# Patient Record
Sex: Male | Born: 1960
Health system: Southern US, Community
[De-identification: ages and names within clinical notes are randomized; demographics above are authoritative.]

## PROBLEM LIST (undated history)

## (undated) DIAGNOSIS — I10 Essential (primary) hypertension: Secondary | ICD-10-CM

## (undated) DIAGNOSIS — E119 Type 2 diabetes mellitus without complications: Secondary | ICD-10-CM

---

## 2012-10-05 ENCOUNTER — Ambulatory Visit: Payer: Self-pay | Admitting: Gastroenterology

## 2012-10-06 LAB — PATHOLOGY REPORT

## 2018-10-02 ENCOUNTER — Encounter: Payer: Self-pay | Admitting: Emergency Medicine

## 2018-10-02 ENCOUNTER — Emergency Department
Admission: EM | Admit: 2018-10-02 | Discharge: 2018-10-02 | Disposition: A | Discharge status: Home / Self Care | Payer: BLUE CROSS/BLUE SHIELD | Attending: Student in an Organized Health Care Education/Training Program | Admitting: Student in an Organized Health Care Education/Training Program

## 2018-10-02 ENCOUNTER — Emergency Department: Payer: BLUE CROSS/BLUE SHIELD

## 2018-10-02 ENCOUNTER — Other Ambulatory Visit: Payer: Self-pay

## 2018-10-02 DIAGNOSIS — E119 Type 2 diabetes mellitus without complications: Secondary | ICD-10-CM | POA: Insufficient documentation

## 2018-10-02 DIAGNOSIS — I10 Essential (primary) hypertension: Secondary | ICD-10-CM | POA: Diagnosis not present

## 2018-10-02 DIAGNOSIS — N50811 Right testicular pain: Secondary | ICD-10-CM | POA: Insufficient documentation

## 2018-10-02 DIAGNOSIS — N130 Hydronephrosis with ureteropelvic junction obstruction: Secondary | ICD-10-CM | POA: Diagnosis not present

## 2018-10-02 DIAGNOSIS — Z79899 Other long term (current) drug therapy: Secondary | ICD-10-CM | POA: Diagnosis not present

## 2018-10-02 DIAGNOSIS — Q6211 Congenital occlusion of ureteropelvic junction: Secondary | ICD-10-CM

## 2018-10-02 DIAGNOSIS — N201 Calculus of ureter: Secondary | ICD-10-CM | POA: Insufficient documentation

## 2018-10-02 HISTORY — DX: Type 2 diabetes mellitus without complications: E11.9

## 2018-10-02 HISTORY — DX: Essential (primary) hypertension: I10

## 2018-10-02 LAB — URINALYSIS, COMPLETE (UACMP) WITH MICROSCOPIC
Bacteria, UA: NONE SEEN
Bilirubin Urine: NEGATIVE
Glucose, UA: >=500 mg/dL — AB
Hgb urine dipstick: NEGATIVE
Ketones, ur: 20 mg/dL — AB
Leukocytes, UA: NEGATIVE
Nitrite: NEGATIVE
Protein, ur: NEGATIVE mg/dL
Specific Gravity, Urine: 1.025 (ref 1.005–1.030)
pH: 5 (ref 5.0–8.0)

## 2018-10-02 LAB — CBC WITH DIFFERENTIAL/PLATELET
Abs Immature Granulocytes: 0.09 10*3/uL — ABNORMAL HIGH (ref 0.00–0.07)
Basophils Absolute: 0.1 10*3/uL (ref 0.0–0.1)
Basophils Relative: 1 %
Eosinophils Absolute: 0.1 10*3/uL (ref 0.0–0.5)
Eosinophils Relative: 1 %
HCT: 41.2 % (ref 39.0–52.0)
Hemoglobin: 13.4 g/dL (ref 13.0–17.0)
Immature Granulocytes: 1 %
Lymphocytes Relative: 6 %
Lymphs Abs: 0.9 10*3/uL (ref 0.7–4.0)
MCH: 28 pg (ref 26.0–34.0)
MCHC: 32.5 g/dL (ref 30.0–36.0)
MCV: 86.2 fL (ref 80.0–100.0)
Monocytes Absolute: 0.9 10*3/uL (ref 0.1–1.0)
Monocytes Relative: 7 %
Neutro Abs: 12.4 10*3/uL — ABNORMAL HIGH (ref 1.7–7.7)
Neutrophils Relative %: 84 %
Platelets: 282 10*3/uL (ref 150–400)
RBC: 4.78 MIL/uL (ref 4.22–5.81)
RDW: 13.2 % (ref 11.5–15.5)
WBC: 14.5 10*3/uL — ABNORMAL HIGH (ref 4.0–10.5)
nRBC: 0 % (ref 0.0–0.2)

## 2018-10-02 LAB — COMPREHENSIVE METABOLIC PANEL
ALT: 19 U/L (ref 0–44)
AST: 22 U/L (ref 15–41)
Albumin: 4.1 g/dL (ref 3.5–5.0)
Alkaline Phosphatase: 53 U/L (ref 38–126)
Anion gap: 13 (ref 5–15)
BUN: 25 mg/dL — ABNORMAL HIGH (ref 6–20)
CO2: 22 mmol/L (ref 22–32)
Calcium: 9.1 mg/dL (ref 8.9–10.3)
Chloride: 103 mmol/L (ref 98–111)
Creatinine, Ser: 1.4 mg/dL — ABNORMAL HIGH (ref 0.61–1.24)
GFR calc Af Amer: >60 mL/min (ref >60)
GFR calc non Af Amer: 54 mL/min — ABNORMAL LOW (ref >60)
Glucose, Bld: 272 mg/dL — ABNORMAL HIGH (ref 70–99)
Potassium: 4 mmol/L (ref 3.5–5.1)
Sodium: 138 mmol/L (ref 135–145)
Total Bilirubin: 0.7 mg/dL (ref 0.3–1.2)
Total Protein: 7.6 g/dL (ref 6.5–8.1)

## 2018-10-02 MED ORDER — MORPHINE SULFATE (PF) 4 MG/ML IV SOLN
4.0000 mg | INTRAVENOUS | Status: DC | PRN
  Filled 2018-10-02: qty 1

## 2018-10-02 MED ORDER — SODIUM CHLORIDE 0.9 % IV BOLUS
500.0000 mL | Freq: Once | INTRAVENOUS | Status: AC
  Administered 2018-10-02: 500 mL via INTRAVENOUS

## 2018-10-02 MED ORDER — HYDROCODONE-ACETAMINOPHEN 5-325 MG PO TABS
1.0000 | ORAL_TABLET | Freq: Once | ORAL | Status: AC
  Administered 2018-10-02: 1 via ORAL
  Filled 2018-10-02: qty 1

## 2018-10-02 MED ORDER — TAMSULOSIN HCL 0.4 MG PO CAPS
0.4000 mg | ORAL_CAPSULE | Freq: Every day | ORAL | Status: DC
  Administered 2018-10-02: 0.4 mg via ORAL
  Filled 2018-10-02: qty 1

## 2018-10-02 MED ORDER — HYDROCODONE-ACETAMINOPHEN 5-325 MG PO TABS: 1.0000 | ORAL_TABLET | ORAL | 0 refills | Status: DC | PRN

## 2018-10-02 MED ORDER — PROMETHAZINE HCL 12.5 MG PO TABS: 12.5000 mg | ORAL_TABLET | Freq: Four times a day (QID) | ORAL | 0 refills | Status: DC | PRN

## 2018-10-02 MED ORDER — PROMETHAZINE HCL 25 MG/ML IJ SOLN
12.5000 mg | Freq: Four times a day (QID) | INTRAMUSCULAR | Status: DC | PRN
  Administered 2018-10-02: 12.5 mg via INTRAVENOUS
  Filled 2018-10-02: qty 1

## 2018-10-02 MED ORDER — KETOROLAC TROMETHAMINE 30 MG/ML IJ SOLN
15.0000 mg | Freq: Once | INTRAMUSCULAR | Status: AC
  Administered 2018-10-02: 15 mg via INTRAVENOUS
  Filled 2018-10-02: qty 1

## 2018-10-02 MED ORDER — TAMSULOSIN HCL 0.4 MG PO CAPS: 0.4000 mg | ORAL_CAPSULE | Freq: Every day | ORAL | 0 refills | Status: DC

## 2018-10-02 NOTE — ED Triage Notes (Signed)
Patient ambulatory to triage with steady gait, without difficulty, appears uncomfortable; st having right testicular pain radiating up into abd with diff urinating this am; denies hx of same

## 2018-10-02 NOTE — ED Notes (Signed)
Pt confirmed wife would be driving him home.

## 2018-10-02 NOTE — ED Provider Notes (Signed)
[facility] Emergency Department Provider Note    First MD Initiated Contact with Patient 10/02/18 [phone]     (approximate)  I have reviewed the triage vital signs and the nursing notes.   HISTORY  Chief Complaint Right testicle pain   HPI Aaron Smith is a 57 y.o. male presents the ER for evaluation of sudden onset groin and testicular pain that started around 250 this morning.  States it is waxing and waning.  Mild to moderate in severity.  Denies any dysuria or hematuria.  Is never had pain quite like this before.  Status post appendectomy.  Denies any nausea or vomiting.  No fevers.    Past Medical History:  Diagnosis Date  . Diabetes mellitus without complication (HCC)   . Hypertension    No family history on file.  There are no active problems to display for this patient.     Prior to Admission medications   Medication Sig Start Date End Date Taking? Authorizing Provider  lisinopril-hydrochlorothiazide (PRINZIDE,ZESTORETIC) 20-12.5 MG tablet Take 1 tablet by mouth daily. 01/14/18  Yes [provider]  metFORMIN (GLUCOPHAGE-XR) 500 MG 24 hr tablet Take 1,000 mg by mouth 2 (two) times daily. 12/22/17  Yes [provider]  HYDROcodone-acetaminophen (NORCO) 5-325 MG tablet Take 1 tablet by mouth every 4 (four) hours as needed for moderate pain. 10/02/18   Willy Eddy, MD  promethazine (PHENERGAN) 12.5 MG tablet Take 1 tablet (12.5 mg total) by mouth every 6 (six) hours as needed for nausea or vomiting. 10/02/18   Willy Eddy, MD  tamsulosin (FLOMAX) 0.4 MG CAPS capsule Take 1 capsule (0.4 mg total) by mouth daily after supper. 10/02/18   Willy Eddy, MD    Allergies Penicillins    Social History Social History   Tobacco Use  . Smoking status: Never Smoker  . Smokeless tobacco: Never Used  Substance Use Topics  . Alcohol use: Not on file  . Drug use: Not on file    Review of Systems Patient denies  headaches, rhinorrhea, blurry vision, numbness, shortness of breath, chest pain, edema, cough, abdominal pain, nausea, vomiting, diarrhea, dysuria, fevers, rashes or hallucinations unless otherwise stated above in HPI. ____________________________________________   PHYSICAL EXAM:  VITAL SIGNS: Vitals:   10/02/18 0930 10/02/18 1030  BP: 140/79 118/74  Pulse: (!) 58 65  Resp: 20 18  Temp:    SpO2: 100% 100%    Constitutional: Alert and oriented.  Eyes: Conjunctivae are normal.  Head: Atraumatic. Nose: No congestion/rhinnorhea. Mouth/Throat: Mucous membranes are moist.   Neck: No stridor. Painless ROM.  Cardiovascular: Normal rate, regular rhythm. Grossly normal heart sounds.  Good peripheral circulation. Respiratory: Normal respiratory effort.  No retractions. Lungs CTAB. Gastrointestinal: Soft and nontender. No distention. No abdominal bruits. No CVA tenderness. Genitourinary: normal external genitalia, + cremasteric reflex, no masses Musculoskeletal: No lower extremity tenderness nor edema.  No joint effusions. Neurologic:  Normal speech and language. No gross focal neurologic deficits are appreciated. No facial droop Skin:  Skin is warm, dry and intact. No rash noted. Psychiatric: Mood and affect are normal. Speech and behavior are normal.  ____________________________________________   LABS (all labs ordered are listed, but only abnormal results are displayed)  Results for orders placed or performed during the hospital encounter of 10/02/18 (from the past 24 hour(s))  CBC with Differential/Platelet     Status: Abnormal   Collection Time: 10/02/18  7:20 AM  Result Value Ref Range   WBC 14.5 (H) 4.0 -  10.5 K/uL   RBC 4.78 4.22 - 5.81 MIL/uL   Hemoglobin 13.4 13.0 - 17.0 g/dL   HCT 95.641.2 21.339.0 - 08.652.0 %   MCV 86.2 80.0 - 100.0 fL   MCH 28.0 26.0 - 34.0 pg   MCHC 32.5 30.0 - 36.0 g/dL   RDW 57.813.2 46.911.5 - 62.915.5 %   Platelets 282 150 - 400 K/uL   nRBC 0.0 0.0 - 0.2 %    Neutrophils Relative % 84 %   Neutro Abs 12.4 (H) 1.7 - 7.7 K/uL   Lymphocytes Relative 6 %   Lymphs Abs 0.9 0.7 - 4.0 K/uL   Monocytes Relative 7 %   Monocytes Absolute 0.9 0.1 - 1.0 K/uL   Eosinophils Relative 1 %   Eosinophils Absolute 0.1 0.0 - 0.5 K/uL   Basophils Relative 1 %   Basophils Absolute 0.1 0.0 - 0.1 K/uL   Immature Granulocytes 1 %   Abs Immature Granulocytes 0.09 (H) 0.00 - 0.07 K/uL  Comprehensive metabolic panel     Status: Abnormal   Collection Time: 10/02/18  7:20 AM  Result Value Ref Range   Sodium 138 135 - 145 mmol/L   Potassium 4.0 3.5 - 5.1 mmol/L   Chloride 103 98 - 111 mmol/L   CO2 22 22 - 32 mmol/L   Glucose, Bld 272 (H) 70 - 99 mg/dL   BUN 25 (H) 6 - 20 mg/dL   Creatinine, Ser 5.281.40 (H) 0.61 - 1.24 mg/dL   Calcium 9.1 8.9 - 41.310.3 mg/dL   Total Protein 7.6 6.5 - 8.1 g/dL   Albumin 4.1 3.5 - 5.0 g/dL   AST 22 15 - 41 U/L   ALT 19 0 - 44 U/L   Alkaline Phosphatase 53 38 - 126 U/L   Total Bilirubin 0.7 0.3 - 1.2 mg/dL   GFR calc non Af Amer 54 (L) >60 mL/min   GFR calc Af Amer >60 >60 mL/min   Anion gap 13 5 - 15  Urinalysis, Complete w Microscopic     Status: Abnormal   Collection Time: 10/02/18  8:39 AM  Result Value Ref Range   Color, Urine YELLOW (A) YELLOW   APPearance CLEAR (A) CLEAR   Specific Gravity, Urine 1.025 1.005 - 1.030   pH 5.0 5.0 - 8.0   Glucose, UA >=500 (A) NEGATIVE mg/dL   Hgb urine dipstick NEGATIVE NEGATIVE   Bilirubin Urine NEGATIVE NEGATIVE   Ketones, ur 20 (A) NEGATIVE mg/dL   Protein, ur NEGATIVE NEGATIVE mg/dL   Nitrite NEGATIVE NEGATIVE   Leukocytes, UA NEGATIVE NEGATIVE   RBC / HPF 0-5 0 - 5 RBC/hpf   WBC, UA 0-5 0 - 5 WBC/hpf   Bacteria, UA NONE SEEN NONE SEEN   Squamous Epithelial / LPF 0-5 0 - 5   Mucus PRESENT    ________________________________________________________________________________________  RADIOLOGY  I personally reviewed all radiographic images ordered to evaluate for the above acute  complaints and reviewed radiology reports and findings.  These findings were personally discussed with the patient.  Please see medical record for radiology report.  ____________________________________________   PROCEDURES  Procedure(s) performed:  Procedures    Critical Care performed: no ____________________________________________   INITIAL IMPRESSION / ASSESSMENT AND PLAN / ED COURSE  Pertinent labs & imaging results that were available during my care of the patient were reviewed by me and considered in my medical decision making (see chart for details).   DDX: torsion, hernia, stone, epididymitis, abscess  Payton EmeraldWayne S Somero is a 57  y.o. who presents to the ED with right-sided groin pain and testicular pain as described above.  Patient does appear very uncomfortable.  Exam not consistent with torsion or mass but will order ultrasound to exclude other acute scrotal pathology.  Based on his severe pain will order CT renal to exclude stone.  Will provide IV pain medication as well as IV fluids.  Clinical Course as of Oct 02 1348  Fri Oct 02, 2018  0912 Pain improved.  No evidence of UTI.   Borderline leukocytosis likely 2/2 pain.  No other evidence of SIRS. Discussed results of CT imaging and ultrasound with patient.  Will encourage oral hydration and see if we can transition him to oral pain medications for pain control.   [PR]  1035 I consulted on-call urology, Dr. Richardo Hanks, regarding results.  Patient stable and appropriate for discharge home with close outpatient follow-up.   [PR]    Clinical Course User Index [PR] Willy Eddy, MD     As part of my medical decision making, I reviewed the following data within the electronic MEDICAL RECORD NUMBER Nursing notes reviewed and incorporated, Labs reviewed, notes from prior ED visits.   ____________________________________________   FINAL CLINICAL IMPRESSION(S) / ED DIAGNOSES  Final diagnoses:  Pain in right testicle    Ureterolithiasis  Hydronephrosis with ureteropelvic junction (UPJ) obstruction      NEW MEDICATIONS STARTED DURING THIS VISIT:  Discharge Medication List as of 10/02/2018 10:38 AM    START taking these medications   Details  HYDROcodone-acetaminophen (NORCO) 5-325 MG tablet Take 1 tablet by mouth every 4 (four) hours as needed for moderate pain., Starting Fri 10/02/2018, Print    promethazine (PHENERGAN) 12.5 MG tablet Take 1 tablet (12.5 mg total) by mouth every 6 (six) hours as needed for nausea or vomiting., Starting Fri 10/02/2018, Print    tamsulosin (FLOMAX) 0.4 MG CAPS capsule Take 1 capsule (0.4 mg total) by mouth daily after supper., Starting Fri 10/02/2018, Print         Note:  This document was prepared using Dragon voice recognition software and may include unintentional dictation errors.    Willy Eddy, MD 10/02/18 1349

## 2018-10-05 ENCOUNTER — Encounter: Payer: Self-pay | Admitting: Urology

## 2018-10-05 ENCOUNTER — Other Ambulatory Visit: Payer: Self-pay

## 2018-10-05 ENCOUNTER — Ambulatory Visit: Payer: BLUE CROSS/BLUE SHIELD | Admitting: Urology

## 2018-10-05 ENCOUNTER — Encounter
Admission: RE | Admit: 2018-10-05 | Discharge: 2018-10-05 | Disposition: A | Discharge status: Home / Self Care | Payer: BLUE CROSS/BLUE SHIELD | Source: Ambulatory Visit | Attending: Urology | Admitting: Urology

## 2018-10-05 ENCOUNTER — Ambulatory Visit
Admission: RE | Admit: 2018-10-05 | Discharge: 2018-10-05 | Disposition: A | Discharge status: Home / Self Care | Payer: BLUE CROSS/BLUE SHIELD | Source: Ambulatory Visit | Attending: Urology | Admitting: Urology

## 2018-10-05 ENCOUNTER — Other Ambulatory Visit: Payer: Self-pay | Admitting: Radiology

## 2018-10-05 VITALS — BP 131/93 | HR 88 | Ht 70.0 in | Wt 231.0 lb

## 2018-10-05 DIAGNOSIS — N201 Calculus of ureter: Secondary | ICD-10-CM

## 2018-10-05 DIAGNOSIS — N50811 Right testicular pain: Secondary | ICD-10-CM

## 2018-10-05 LAB — MICROSCOPIC EXAMINATION: RBC, UA: >30 /hpf — ABNORMAL HIGH (ref 0–2)

## 2018-10-05 LAB — URINALYSIS, COMPLETE
Bilirubin, UA: NEGATIVE
Glucose, UA: NEGATIVE
Ketones, UA: NEGATIVE
Nitrite, UA: NEGATIVE
Specific Gravity, UA: >1.03 — ABNORMAL HIGH (ref 1.005–1.030)
Urobilinogen, Ur: 1 mg/dL (ref 0.2–1.0)
pH, UA: 5.5 (ref 5.0–7.5)

## 2018-10-05 NOTE — Progress Notes (Signed)
 10/05/2018 3:15 PM   Aaron Smith 03/15/1961 [id number]  CC: Right flank pain  HPI: I saw Aaron Smith in urology clinic today for right-sided flank pain.  He presented to the emergency department on 10/02/2018 with 12 hours of acute onset severe, sharp, shooting right-sided flank and groin pain down to the right testicle.  CT scan showed a right sided distal ureteral 9 mm stone with upstream hydronephrosis.  Urine was noninfected, and labs were benign.  He was discharged on Flomax and with oral pain control.  He reports his pain has been moderately controlled over the weekend.  He has not had any severe pain today.  He denies any nausea, fevers, or shaking chills.  There are no aggravating factors.  Severity is moderate to severe.  He thinks he had a stone that passed spontaneously 15 to 20 years ago, but has never required surgery.  He is otherwise a relatively healthy, with medical history notable for diabetes and hypertension.   PMH: Past Medical History:  Diagnosis Date  . Diabetes mellitus without complication (HCC)   . Hypertension     Surgical History: History reviewed. No pertinent surgical history.  Allergies:  Allergies  Allergen Reactions  . Penicillins     Family History: History reviewed. No pertinent family history.  Social History:  reports that he has never smoked. He has never used smokeless tobacco. His alcohol and drug histories are not on file.  ROS: Please see flowsheet from today's date for complete review of systems.  Physical Exam: BP (!) 131/93   Pulse 88   Ht 5' 10" (1.778 m)   Wt 231 lb (104.8 kg)   SpO2 98%   BMI 33.15 kg/m    Constitutional:  Alert and oriented, No acute distress. Cardiovascular: Regular rate and rhythm Respiratory: Clear to auscultation bilaterally GI: Abdomen is soft, nontender, nondistended, no abdominal masses GU: Right CVA tenderness Lymph: No cervical or inguinal lymphadenopathy. Skin: No rashes, bruises or  suspicious lesions. Neurologic: Grossly intact, no focal deficits, moving all 4 extremities. Psychiatric: Normal mood and affect.  Laboratory Data: Urinalysis 10/02/2018: No bacteria, 0-5 WBCs, 0-5 RBCs  Pertinent Imaging: I have personally reviewed the CT stone protocol dated 10/02/2018.  Right distal 9 mm ureteral stone with upstream hydronephrosis.  Assessment & Plan:   In summary, Aaron Smith is a healthy 57-year-old man with 3 days of intermittent severe right-sided flank pain, and 9 mm noninfected right distal ureteral stone on CT scan.  We discussed various treatment options for urolithiasis including observation with or without medical expulsive therapy, shockwave lithotripsy (SWL), ureteroscopy and laser lithotripsy with stent placement, and percutaneous nephrolithotomy.  We discussed that management is based on stone size, location, density, patient co-morbidities, and patient preference.   Stones <5mm in size have a >80% spontaneous passage rate. Data surrounding the use of tamsulosin for medical expulsive therapy is controversial, but meta analyses suggests it is most efficacious for distal stones between 5-10mm in size. Possible side effects include dizziness/lightheadedness, and retrograde ejaculation.  SWL has a lower stone free rate in a single procedure, but also a lower complication rate compared to ureteroscopy and avoids a stent and associated stent related symptoms. Possible complications include renal hematoma, steinstrasse, and need for additional treatment.  Ureteroscopy with laser lithotripsy and stent placement has a higher stone free rate than SWL in a single procedure, however increased complication rate including possible infection, ureteral injury, bleeding, and stent related morbidity. Common stent related symptoms include dysuria,   urgency/frequency, and flank pain.  After an extensive discussion of the risks and benefits of the above treatment options, the  patient would like to proceed with right ureteroscopy, laser lithotripsy, stent placement  -KUB today to confirm presence of stone -Continue Flomax and strain urine -Schedule right ureteroscopy, laser lithotripsy, stent placement with my partner Dr. Stoioff this Wednesday  Kenyanna Grzesiak C Ellianna Ruest, MD  [facility] [address], [address] [place], [place] [address] [phone]   

## 2018-10-05 NOTE — H&P (View-Only) (Signed)
10/05/2018 3:15 PM   Aaron Smith Jun 22, 1961 [id number]  CC: Right flank pain  HPI: I saw Aaron Smith in urology clinic today for right-sided flank pain.  He presented to the emergency department on 10/02/2018 with 12 hours of acute onset severe, sharp, shooting right-sided flank and groin pain down to the right testicle.  CT scan showed a right sided distal ureteral 9 mm stone with upstream hydronephrosis.  Urine was noninfected, and labs were benign.  He was discharged on Flomax and with oral pain control.  He reports his pain has been moderately controlled over the weekend.  He has not had any severe pain today.  He denies any nausea, fevers, or shaking chills.  There are no aggravating factors.  Severity is moderate to severe.  He thinks he had a stone that passed spontaneously 15 to 20 years ago, but has never required surgery.  He is otherwise a relatively healthy, with medical history notable for diabetes and hypertension.   PMH: Past Medical History:  Diagnosis Date  . Diabetes mellitus without complication (HCC)   . Hypertension     Surgical History: History reviewed. No pertinent surgical history.  Allergies:  Allergies  Allergen Reactions  . Penicillins     Family History: History reviewed. No pertinent family history.  Social History:  reports that he has never smoked. He has never used smokeless tobacco. His alcohol and drug histories are not on file.  ROS: Please see flowsheet from today's date for complete review of systems.  Physical Exam: BP (!) 131/93   Pulse 88   Ht 5\' 10"  (1.778 m)   Wt 231 lb (104.8 kg)   SpO2 98%   BMI 33.15 kg/m    Constitutional:  Alert and oriented, No acute distress. Cardiovascular: Regular rate and rhythm Respiratory: Clear to auscultation bilaterally GI: Abdomen is soft, nontender, nondistended, no abdominal masses GU: Right CVA tenderness Lymph: No cervical or inguinal lymphadenopathy. Skin: No rashes, bruises or  suspicious lesions. Neurologic: Grossly intact, no focal deficits, moving all 4 extremities. Psychiatric: Normal mood and affect.  Laboratory Data: Urinalysis 10/02/2018: No bacteria, 0-5 WBCs, 0-5 RBCs  Pertinent Imaging: I have personally reviewed the CT stone protocol dated 10/02/2018.  Right distal 9 mm ureteral stone with upstream hydronephrosis.  Assessment & Plan:   In summary, Aaron Smith is a healthy 57 year old man with 3 days of intermittent severe right-sided flank pain, and 9 mm noninfected right distal ureteral stone on CT scan.  We discussed various treatment options for urolithiasis including observation with or without medical expulsive therapy, shockwave lithotripsy (SWL), ureteroscopy and laser lithotripsy with stent placement, and percutaneous nephrolithotomy.  We discussed that management is based on stone size, location, density, patient co-morbidities, and patient preference.   Stones <30mm in size have a >80% spontaneous passage rate. Data surrounding the use of tamsulosin for medical expulsive therapy is controversial, but meta analyses suggests it is most efficacious for distal stones between 5-24mm in size. Possible side effects include dizziness/lightheadedness, and retrograde ejaculation.  SWL has a lower stone free rate in a single procedure, but also a lower complication rate compared to ureteroscopy and avoids a stent and associated stent related symptoms. Possible complications include renal hematoma, steinstrasse, and need for additional treatment.  Ureteroscopy with laser lithotripsy and stent placement has a higher stone free rate than SWL in a single procedure, however increased complication rate including possible infection, ureteral injury, bleeding, and stent related morbidity. Common stent related symptoms include dysuria,  urgency/frequency, and flank pain.  After an extensive discussion of the risks and benefits of the above treatment options, the  patient would like to proceed with right ureteroscopy, laser lithotripsy, stent placement  -KUB today to confirm presence of stone -Continue Flomax and strain urine -Schedule right ureteroscopy, laser lithotripsy, stent placement with my partner Dr. Lonna CobbStoioff this Wednesday  Sondra ComeBrian C Consandra Laske, MD  [facility] [address], [address] [place], [place] [address] [phone]

## 2018-10-06 ENCOUNTER — Telehealth: Payer: Self-pay

## 2018-10-06 MED ORDER — CIPROFLOXACIN IN D5W 400 MG/200ML IV SOLN
400.0000 mg | INTRAVENOUS | Status: AC
  Administered 2018-10-07: 400 mg via INTRAVENOUS

## 2018-10-06 NOTE — Telephone Encounter (Signed)
-----   Message from Sondra ComeBrian C Sninsky, MD sent at 10/06/2018 12:44 PM EST ----- Regarding: surgery tomorrow Aaron Smith,  This patient is [facility] KUB shows stone still in same spot as before. His original urine last week was non-infected, but his urine from yesterday came back with possible infection v contamination.  I tried to reach him today to discuss, but have gotten voicemail multiple times.  Please let him know KUB results, and that if Dr. Lonna CobbStoioff sees any signs of infection he may just need a stent tomorrow, with staged procedure. He was clinically stable and doing fine yesterday when I saw him in clinic. If he has fevers over 101.3, needs to go to ED or let us know.   Thanks Legrand RamsBrian Sninsky, MD 10/06/2018    ----- Message ----- From: Leory PlowmanInterface, Rad Results In Sent: 10/06/2018   8:54 AM EST To: Sondra ComeBrian C Sninsky, MD

## 2018-10-06 NOTE — Telephone Encounter (Signed)
Patient denies fever. Informed patient of Dr Keane ScrapeSninsky's note below. Questions answered. Patient expresses understanding of conversation.

## 2018-10-06 NOTE — Telephone Encounter (Signed)
Left pt mess to call back  Patient's wife notified of message and states patient does not have fever

## 2018-10-07 ENCOUNTER — Other Ambulatory Visit: Payer: Self-pay

## 2018-10-07 ENCOUNTER — Encounter
Admission: RE | Disposition: A | Discharge status: Home / Self Care | Payer: Self-pay | Source: Ambulatory Visit | Attending: Urology

## 2018-10-07 ENCOUNTER — Ambulatory Visit: Payer: BLUE CROSS/BLUE SHIELD | Admitting: Anesthesiology

## 2018-10-07 ENCOUNTER — Ambulatory Visit
Admission: RE | Admit: 2018-10-07 | Discharge: 2018-10-07 | Disposition: A | Discharge status: Home / Self Care | Payer: BLUE CROSS/BLUE SHIELD | Source: Ambulatory Visit | Attending: Urology | Admitting: Urology

## 2018-10-07 DIAGNOSIS — I10 Essential (primary) hypertension: Secondary | ICD-10-CM | POA: Insufficient documentation

## 2018-10-07 DIAGNOSIS — Z880 Allergy status to penicillin: Secondary | ICD-10-CM | POA: Insufficient documentation

## 2018-10-07 DIAGNOSIS — N132 Hydronephrosis with renal and ureteral calculous obstruction: Secondary | ICD-10-CM | POA: Insufficient documentation

## 2018-10-07 DIAGNOSIS — E119 Type 2 diabetes mellitus without complications: Secondary | ICD-10-CM | POA: Diagnosis not present

## 2018-10-07 DIAGNOSIS — N201 Calculus of ureter: Secondary | ICD-10-CM

## 2018-10-07 HISTORY — PX: CYSTOSCOPY/URETEROSCOPY/HOLMIUM LASER/STENT PLACEMENT: SHX6546

## 2018-10-07 LAB — GLUCOSE, CAPILLARY
Glucose-Capillary: 145 mg/dL — ABNORMAL HIGH (ref 70–99)
Glucose-Capillary: 170 mg/dL — ABNORMAL HIGH (ref 70–99)

## 2018-10-07 SURGERY — CYSTOSCOPY/URETEROSCOPY/HOLMIUM LASER/STENT PLACEMENT
Anesthesia: General | Laterality: Right

## 2018-10-07 MED ORDER — CIPROFLOXACIN IN D5W 400 MG/200ML IV SOLN
INTRAVENOUS | Status: AC
  Filled 2018-10-07: qty 200

## 2018-10-07 MED ORDER — FAMOTIDINE 20 MG PO TABS
20.0000 mg | ORAL_TABLET | Freq: Once | ORAL | Status: AC
  Administered 2018-10-07: 20 mg via ORAL

## 2018-10-07 MED ORDER — DEXAMETHASONE SODIUM PHOSPHATE 10 MG/ML IJ SOLN
INTRAMUSCULAR | Status: AC
  Filled 2018-10-07: qty 1

## 2018-10-07 MED ORDER — ONDANSETRON HCL 4 MG/2ML IJ SOLN
INTRAMUSCULAR | Status: AC
  Filled 2018-10-07: qty 2

## 2018-10-07 MED ORDER — OXYBUTYNIN CHLORIDE 5 MG PO TABS: ORAL_TABLET | ORAL | 0 refills | Status: DC

## 2018-10-07 MED ORDER — IOPAMIDOL (ISOVUE-200) INJECTION 41%
INTRAVENOUS | Status: DC | PRN
  Administered 2018-10-07: 20 mL

## 2018-10-07 MED ORDER — SULFAMETHOXAZOLE-TRIMETHOPRIM 800-160 MG PO TABS: 1.0000 | ORAL_TABLET | Freq: Two times a day (BID) | ORAL | 0 refills | Status: AC

## 2018-10-07 MED ORDER — MIDAZOLAM HCL 5 MG/5ML IJ SOLN
INTRAMUSCULAR | Status: DC | PRN
  Administered 2018-10-07: 2 mg via INTRAVENOUS

## 2018-10-07 MED ORDER — ONDANSETRON HCL 4 MG/2ML IJ SOLN
INTRAMUSCULAR | Status: DC | PRN
  Administered 2018-10-07: 4 mg via INTRAVENOUS

## 2018-10-07 MED ORDER — FAMOTIDINE 20 MG PO TABS
ORAL_TABLET | ORAL | Status: AC
  Administered 2018-10-07: 20 mg via ORAL
  Filled 2018-10-07: qty 1

## 2018-10-07 MED ORDER — FENTANYL CITRATE (PF) 100 MCG/2ML IJ SOLN
INTRAMUSCULAR | Status: AC
  Filled 2018-10-07: qty 2

## 2018-10-07 MED ORDER — LIDOCAINE HCL (CARDIAC) PF 100 MG/5ML IV SOSY
PREFILLED_SYRINGE | INTRAVENOUS | Status: DC | PRN
  Administered 2018-10-07: 60 mg via INTRAVENOUS

## 2018-10-07 MED ORDER — EPHEDRINE SULFATE 50 MG/ML IJ SOLN
INTRAMUSCULAR | Status: DC | PRN
  Administered 2018-10-07: 5 mg via INTRAVENOUS

## 2018-10-07 MED ORDER — FENTANYL CITRATE (PF) 100 MCG/2ML IJ SOLN
INTRAMUSCULAR | Status: DC | PRN
  Administered 2018-10-07 (×2): 25 ug via INTRAVENOUS
  Administered 2018-10-07 (×2): 50 ug via INTRAVENOUS

## 2018-10-07 MED ORDER — PROPOFOL 10 MG/ML IV BOLUS
INTRAVENOUS | Status: AC
  Filled 2018-10-07: qty 20

## 2018-10-07 MED ORDER — HYDROCODONE-ACETAMINOPHEN 5-325 MG PO TABS: 1.0000 | ORAL_TABLET | ORAL | 0 refills | Status: DC | PRN

## 2018-10-07 MED ORDER — SODIUM CHLORIDE 0.9 % IV SOLN
INTRAVENOUS | Status: DC
  Administered 2018-10-07 (×2): via INTRAVENOUS

## 2018-10-07 MED ORDER — FENTANYL CITRATE (PF) 100 MCG/2ML IJ SOLN: 25.0000 ug | INTRAMUSCULAR | Status: DC | PRN

## 2018-10-07 MED ORDER — SODIUM CHLORIDE FLUSH 0.9 % IV SOLN
INTRAVENOUS | Status: AC
  Filled 2018-10-07: qty 10

## 2018-10-07 MED ORDER — MIDAZOLAM HCL 2 MG/2ML IJ SOLN
INTRAMUSCULAR | Status: AC
  Filled 2018-10-07: qty 2

## 2018-10-07 MED ORDER — DEXAMETHASONE SODIUM PHOSPHATE 10 MG/ML IJ SOLN
INTRAMUSCULAR | Status: DC | PRN
  Administered 2018-10-07: 5 mg via INTRAVENOUS

## 2018-10-07 MED ORDER — PROMETHAZINE HCL 25 MG/ML IJ SOLN: 6.2500 mg | INTRAMUSCULAR | Status: DC | PRN

## 2018-10-07 MED ORDER — PROPOFOL 10 MG/ML IV BOLUS
INTRAVENOUS | Status: DC | PRN
  Administered 2018-10-07: 180 mg via INTRAVENOUS

## 2018-10-07 SURGICAL SUPPLY — 29 items
BAG DRAIN CYSTO-URO LG1000N (MISCELLANEOUS) ×2 IMPLANT
BASKET ZERO TIP 1.9FR (BASKET) ×2 IMPLANT
BRUSH SCRUB EZ 1% IODOPHOR (MISCELLANEOUS) ×2 IMPLANT
CATH URETL 5X70 OPEN END (CATHETERS) IMPLANT
CNTNR SPEC 2.5X3XGRAD LEK (MISCELLANEOUS) ×1
CONRAY 43 FOR UROLOGY 50M (MISCELLANEOUS) ×2 IMPLANT
CONT SPEC 4OZ STER OR WHT (MISCELLANEOUS) ×1
CONTAINER SPEC 2.5X3XGRAD LEK (MISCELLANEOUS) ×1 IMPLANT
DRAPE UTILITY 15X26 TOWEL STRL (DRAPES) ×2 IMPLANT
FIBER LASER LITHO 273 (Laser) ×2 IMPLANT
GLOVE BIO SURGEON STRL SZ8 (GLOVE) ×2 IMPLANT
GOWN STRL REUS W/ TWL LRG LVL3 (GOWN DISPOSABLE) ×1 IMPLANT
GOWN STRL REUS W/ TWL XL LVL3 (GOWN DISPOSABLE) ×1 IMPLANT
GOWN STRL REUS W/TWL LRG LVL3 (GOWN DISPOSABLE) ×1
GOWN STRL REUS W/TWL XL LVL3 (GOWN DISPOSABLE) ×1
INFUSOR MANOMETER BAG 3000ML (MISCELLANEOUS) ×2 IMPLANT
INTRODUCER DILATOR DOUBLE (INTRODUCER) IMPLANT
KIT TURNOVER CYSTO (KITS) ×2 IMPLANT
PACK CYSTO AR (MISCELLANEOUS) ×2 IMPLANT
SENSORWIRE 0.038 NOT ANGLED (WIRE) ×2
SET CYSTO W/LG BORE CLAMP LF (SET/KITS/TRAYS/PACK) ×2 IMPLANT
SHEATH URETERAL 12FRX35CM (MISCELLANEOUS) IMPLANT
SOL .9 NS 3000ML IRR  AL (IV SOLUTION) ×1
SOL .9 NS 3000ML IRR UROMATIC (IV SOLUTION) ×1 IMPLANT
STENT URET 6FRX24 CONTOUR (STENTS) IMPLANT
STENT URET 6FRX26 CONTOUR (STENTS) ×2 IMPLANT
SURGILUBE 2OZ TUBE FLIPTOP (MISCELLANEOUS) ×2 IMPLANT
WATER STERILE IRR 1000ML POUR (IV SOLUTION) ×2 IMPLANT
WIRE SENSOR 0.038 NOT ANGLED (WIRE) ×1 IMPLANT

## 2018-10-07 NOTE — Anesthesia Procedure Notes (Signed)
Procedure Name: LMA Insertion Date/Time: 10/07/2018 1:44 PM Performed by: Lily KocherPeralta, Chrishonda Hesch, CRNA Pre-anesthesia Checklist: Patient identified, Patient being monitored, Timeout performed, Emergency Drugs available and Suction available Patient Re-evaluated:Patient Re-evaluated prior to induction Oxygen Delivery Method: Circle system utilized Preoxygenation: Pre-oxygenation with 100% oxygen Induction Type: IV induction Ventilation: Mask ventilation without difficulty LMA: LMA inserted LMA Size: 5.0 Tube type: Oral Number of attempts: 1 Placement Confirmation: positive ETCO2 and breath sounds checked- equal and bilateral Tube secured with: Tape Dental Injury: Teeth and Oropharynx as per pre-operative assessment

## 2018-10-07 NOTE — Anesthesia Post-op Follow-up Note (Signed)
Anesthesia QCDR form completed.        

## 2018-10-07 NOTE — Transfer of Care (Signed)
Immediate Anesthesia Transfer of Care Note  Patient: Aaron Smith  Procedure(s) Performed: CYSTOSCOPY/URETEROSCOPY/HOLMIUM LASER/STENT PLACEMENT (Right )  Patient Location: PACU  Anesthesia Type:General  Level of Consciousness: sedated  Airway & Oxygen Therapy: Patient Spontanous Breathing and Patient connected to face mask oxygen  Post-op Assessment: Report given to RN and Post -op Vital signs reviewed and stable  Post vital signs: Reviewed and stable  Last Vitals:  Vitals Value Taken Time  BP 107/71 10/07/2018  2:48 PM  Temp 35.8 C 10/07/2018  2:48 PM  Pulse 54 10/07/2018  2:49 PM  Resp 16 10/07/2018  2:49 PM  SpO2 100 % 10/07/2018  2:49 PM  Vitals shown include unvalidated device data.  Last Pain:  Vitals:   10/07/18 1050  TempSrc: Temporal  PainSc: 0-No pain         Complications: No apparent anesthesia complications

## 2018-10-07 NOTE — Discharge Instructions (Signed)
Call Dr Lonna CobbStoioff office for increased urgency or grossly bloody urine also for fever greater than 101. Pt to remove stent via string that is in place. If pt has problems with removing stent call Dr Lonna CobbStoioff office. Medications called to pt pharmacy by Dr Lonna CobbStoioff.

## 2018-10-07 NOTE — Op Note (Signed)
Preoperative diagnosis: Right distal ureteral calculus  Postoperative diagnosis: Right distal ureteral calculus  Procedure:  1. Cystoscopy 2. Right ureteroscopy and stone removal 3. Ureteroscopic laser lithotripsy 4. Right ureteral stent placement (6FR) 26 cm 5. Right retrograde pyelography with interpretation  Surgeon: Lorin PicketScott C. Lexine Jaspers, M.D.  Anesthesia: General  Complications: None  Intraoperative findings:  1.  Right retrograde pyelography post procedure showed no filling defects, stone fragments or contrast extravasation  EBL: Minimal  Specimens: 1. Calculus fragments for analysis   Indication: Aaron Smith is a 57 y.o. year old patient with a 9 mm right distal ureteral calculus. After reviewing the management options for treatment, the patient elected to proceed with the above surgical procedure(s). We have discussed the potential benefits and risks of the procedure, side effects of the proposed treatment, the likelihood of the patient achieving the goals of the procedure, and any potential problems that might occur during the procedure or recuperation. Informed consent has been obtained.  Description of procedure:  The patient was taken to the operating room and general anesthesia was induced.  The patient was placed in the dorsal lithotomy position, prepped and draped in the usual sterile fashion, and preoperative antibiotics were administered. A preoperative time-out was performed.   A 21 French cystoscope was lubricated and passed under direct vision.  The urethra was normal in caliber without stricture.  The prostate demonstrated mild lateral lobe enlargement and mild bladder neck elevation.  Panendoscopy was performed and the bladder mucosa showed no erythema, solid or papillary lesions.  Attention was directed to the right ureteral orifice and a 0.038 Sensor wire was then advanced up the ureter into the renal pelvis under fluoroscopic guidance.  A 4.5 Fr semirigid  ureteroscope was then advanced into the ureter next to the guidewire and the calculus was identified.   The stone was then dusted/fragmented with a 273 micron holmium laser fiber on dual settings of 0.2 J and frequency of 40 hz and 0.6J/6 Hz.   All stone fragments were then removed from the ureter with a zero tip nitinol basket.  Reinspection of the ureter revealed no remaining visible stones or fragments.   Retrograde pyelogram was performed with findings as described above.  A 6 FR/ 26 CM stent was placed under fluoroscopic guidance.  The wire was then removed with an adequate stent curl noted in the renal pelvis as well as in the bladder.  The bladder was then emptied and the procedure ended.  The patient appeared to tolerate the procedure well and without complications.  After anesthetic reversal the patient was transported to the PACU in stable condition.    Irineo AxonScott Basha Krygier, MD

## 2018-10-07 NOTE — Anesthesia Preprocedure Evaluation (Signed)
Anesthesia Evaluation  Patient identified by MRN, date of birth, ID band Patient awake    Reviewed: Allergy & Precautions, H&P , NPO status , Patient's Chart, lab work & pertinent test results, reviewed documented beta blocker date and time   History of Anesthesia Complications Negative for: history of anesthetic complications  Airway Mallampati: II  TM Distance: >3 FB Neck ROM: full    Dental  (+) Dental Advidsory Given, Teeth Intact   Pulmonary neg pulmonary ROS,           Cardiovascular Exercise Tolerance: Good hypertension, (-) angina(-) CAD, (-) Past MI, (-) Cardiac Stents and (-) CABG (-) dysrhythmias (-) Valvular Problems/Murmurs     Neuro/Psych negative neurological ROS  negative psych ROS   GI/Hepatic negative GI ROS, Neg liver ROS,   Endo/Other  diabetes, Oral Hypoglycemic Agents  Renal/GU Renal disease (kidney stones)  negative genitourinary   Musculoskeletal   Abdominal   Peds  Hematology negative hematology ROS (+)   Anesthesia Other Findings Past Medical History: No date: Diabetes mellitus without complication (HCC) No date: Hypertension   Reproductive/Obstetrics negative OB ROS                             Anesthesia Physical Anesthesia Plan  ASA: II  Anesthesia Plan: General   Post-op Pain Management:    Induction: Intravenous  PONV Risk Score and Plan: 2 and Ondansetron, Dexamethasone, Treatment may vary due to age or medical condition, Promethazine and Midazolam  Airway Management Planned: Oral ETT and LMA  Additional Equipment:   Intra-op Plan:   Post-operative Plan: Extubation in OR  Informed Consent: I have reviewed the patients History and Physical, chart, labs and discussed the procedure including the risks, benefits and alternatives for the proposed anesthesia with the patient or authorized representative who has indicated his/her understanding and  acceptance.   Dental Advisory Given  Plan Discussed with: Anesthesiologist, CRNA and Surgeon  Anesthesia Plan Comments:         Anesthesia Quick Evaluation

## 2018-10-07 NOTE — Interval H&P Note (Signed)
History and Physical Interval Note: Patient is afebrile and no symptoms suggestive of infection.  We will proceed with ureteroscopic stone removal as scheduled  10/07/2018 1:21 PM  Aaron Smith  has presented today for surgery, with the diagnosis of right ureteral stone  The various methods of treatment have been discussed with the patient and family. After consideration of risks, benefits and other options for treatment, the patient has consented to  Procedure(s): CYSTOSCOPY/URETEROSCOPY/HOLMIUM LASER/STENT PLACEMENT (Right) as a surgical intervention .  The patient's history has been reviewed, patient examined, no change in status, stable for surgery.  I have reviewed the patient's chart and labs.  Questions were answered to the patient's satisfaction.     Hally Colella C Soua Caltagirone

## 2018-10-08 ENCOUNTER — Encounter: Payer: Self-pay | Admitting: Urology

## 2018-10-12 NOTE — Anesthesia Postprocedure Evaluation (Signed)
Anesthesia Post Note  Patient: Aaron Smith  Procedure(s) Performed: CYSTOSCOPY/URETEROSCOPY/HOLMIUM LASER/STENT PLACEMENT (Right )  Patient location during evaluation: PACU Anesthesia Type: General Level of consciousness: awake and alert Pain management: pain level controlled Vital Signs Assessment: post-procedure vital signs reviewed and stable Respiratory status: spontaneous breathing, nonlabored ventilation, respiratory function stable and patient connected to nasal cannula oxygen Cardiovascular status: blood pressure returned to baseline and stable Postop Assessment: no apparent nausea or vomiting Anesthetic complications: no     Last Vitals:  Vitals:   10/07/18 1549 10/07/18 1611  BP: (!) 146/87 (!) 128/91  Pulse: (!) 59 62  Resp: 18 18  Temp:    SpO2: 100% 98%    Last Pain:  Vitals:   10/07/18 1549  TempSrc: Temporal  PainSc: 0-No pain                 Lenard SimmerAndrew Yania Bogie

## 2018-10-15 ENCOUNTER — Ambulatory Visit (INDEPENDENT_AMBULATORY_CARE_PROVIDER_SITE_OTHER): Payer: BLUE CROSS/BLUE SHIELD | Admitting: Urology

## 2018-10-15 ENCOUNTER — Other Ambulatory Visit: Payer: Self-pay

## 2018-10-15 ENCOUNTER — Encounter: Payer: Self-pay | Admitting: Urology

## 2018-10-15 VITALS — BP 125/90 | HR 69 | Ht 70.0 in | Wt 228.6 lb

## 2018-10-15 DIAGNOSIS — N201 Calculus of ureter: Secondary | ICD-10-CM

## 2018-10-15 LAB — URINALYSIS, COMPLETE
Bilirubin, UA: NEGATIVE
Glucose, UA: NEGATIVE
Ketones, UA: NEGATIVE
Nitrite, UA: NEGATIVE
Protein, UA: NEGATIVE
RBC, UA: NEGATIVE
Specific Gravity, UA: >1.03 — ABNORMAL HIGH (ref 1.005–1.030)
Urobilinogen, Ur: 0.2 mg/dL (ref 0.2–1.0)
pH, UA: 5 (ref 5.0–7.5)

## 2018-10-15 LAB — MICROSCOPIC EXAMINATION: Epithelial Cells (non renal): NONE SEEN /hpf (ref 0–10)

## 2018-10-15 MED ORDER — CIPROFLOXACIN HCL 500 MG PO TABS: 500.0000 mg | ORAL_TABLET | Freq: Once | ORAL | Status: DC

## 2018-10-15 NOTE — Progress Notes (Signed)
Pt was not seen today. He was placed on schedule for a stent removal, however he had a self removal stent.

## 2018-10-16 LAB — STONE ANALYSIS
Ca Oxalate,Monohydr.: 98 %
Ca phos cry stone ql IR: 2 %
Stone Weight KSTONE: 36.3 mg

## 2019-01-21 ENCOUNTER — Other Ambulatory Visit: Payer: Self-pay

## 2019-01-22 LAB — LIPID PANEL W/O CHOL/HDL RATIO
Cholesterol, Total: 153 mg/dL (ref 100–199)
HDL: 34 mg/dL — ABNORMAL LOW (ref >39)
LDL Calculated: 73 mg/dL (ref 0–99)
Triglycerides: 229 mg/dL — ABNORMAL HIGH (ref 0–149)
VLDL Cholesterol Cal: 46 mg/dL — ABNORMAL HIGH (ref 5–40)

## 2019-01-22 LAB — HEPATIC FUNCTION PANEL
ALT: 16 IU/L (ref 0–44)
AST: 13 IU/L (ref 0–40)
Albumin: 4.2 g/dL (ref 3.8–4.9)
Alkaline Phosphatase: 62 IU/L (ref 39–117)
Bilirubin Total: 0.4 mg/dL (ref 0.0–1.2)
Bilirubin, Direct: 0.12 mg/dL (ref 0.00–0.40)
Total Protein: 7 g/dL (ref 6.0–8.5)

## 2019-07-27 ENCOUNTER — Ambulatory Visit: Payer: Self-pay

## 2019-07-27 ENCOUNTER — Other Ambulatory Visit: Payer: Self-pay

## 2019-07-27 DIAGNOSIS — Z01818 Encounter for other preprocedural examination: Secondary | ICD-10-CM

## 2019-07-27 LAB — POCT URINALYSIS DIPSTICK
Bilirubin, UA: NEGATIVE
Blood, UA: NEGATIVE
Glucose, UA: POSITIVE — AB
Ketones, UA: NEGATIVE
Leukocytes, UA: NEGATIVE
Nitrite, UA: NEGATIVE
Protein, UA: NEGATIVE
Spec Grav, UA: 1.02 (ref 1.010–1.025)
Urobilinogen, UA: 0.2 E.U./dL
pH, UA: 6 (ref 5.0–8.0)

## 2019-07-29 LAB — CMP12+LP+TP+TSH+6AC+PSA+CBC…
ALT: 19 IU/L (ref 0–44)
AST: 18 IU/L (ref 0–40)
Albumin/Globulin Ratio: 1.5 (ref 1.2–2.2)
Albumin: 4.2 g/dL (ref 3.8–4.9)
Alkaline Phosphatase: 61 IU/L (ref 39–117)
BUN/Creatinine Ratio: 15 (ref 9–20)
BUN: 16 mg/dL (ref 6–24)
Basophils Absolute: 0.1 10*3/uL (ref 0.0–0.2)
Basos: 1 %
Bilirubin Total: 0.3 mg/dL (ref 0.0–1.2)
Calcium: 9.3 mg/dL (ref 8.7–10.2)
Chloride: 100 mmol/L (ref 96–106)
Chol/HDL Ratio: 4.7 ratio (ref 0.0–5.0)
Cholesterol, Total: 154 mg/dL (ref 100–199)
Creatinine, Ser: 1.05 mg/dL (ref 0.76–1.27)
EOS (ABSOLUTE): 0.3 10*3/uL (ref 0.0–0.4)
Eos: 4 %
Estimated CHD Risk: 0.9 times avg. (ref 0.0–1.0)
Free Thyroxine Index: 1.7 (ref 1.2–4.9)
GFR calc Af Amer: 89 mL/min/{1.73_m2} (ref >59)
GFR calc non Af Amer: 77 mL/min/{1.73_m2} (ref >59)
GGT: 21 IU/L (ref 0–65)
Globulin, Total: 2.8 g/dL (ref 1.5–4.5)
Glucose: 209 mg/dL — ABNORMAL HIGH (ref 65–99)
HDL: 33 mg/dL — ABNORMAL LOW (ref >39)
Hematocrit: 39.2 % (ref 37.5–51.0)
Hemoglobin: 12.9 g/dL — ABNORMAL LOW (ref 13.0–17.7)
Immature Grans (Abs): 0 10*3/uL (ref 0.0–0.1)
Immature Granulocytes: 0 %
Iron: 66 ug/dL (ref 38–169)
LDH: 140 IU/L (ref 121–224)
LDL Chol Calc (NIH): 81 mg/dL (ref 0–99)
Lymphocytes Absolute: 1.1 10*3/uL (ref 0.7–3.1)
Lymphs: 16 %
MCH: 27.7 pg (ref 26.6–33.0)
MCHC: 32.9 g/dL (ref 31.5–35.7)
MCV: 84 fL (ref 79–97)
Monocytes Absolute: 0.8 10*3/uL (ref 0.1–0.9)
Monocytes: 11 %
Neutrophils Absolute: 4.8 10*3/uL (ref 1.4–7.0)
Neutrophils: 68 %
Phosphorus: 2.7 mg/dL — ABNORMAL LOW (ref 2.8–4.1)
Platelets: 249 10*3/uL (ref 150–450)
Potassium: 4.6 mmol/L (ref 3.5–5.2)
Prostate Specific Ag, Serum: 0.7 ng/mL (ref 0.0–4.0)
RBC: 4.66 x10E6/uL (ref 4.14–5.80)
RDW: 13.2 % (ref 11.6–15.4)
Sodium: 137 mmol/L (ref 134–144)
T3 Uptake Ratio: 25 % (ref 24–39)
T4, Total: 6.8 ug/dL (ref 4.5–12.0)
TSH: 5.48 u[IU]/mL — ABNORMAL HIGH (ref 0.450–4.500)
Total Protein: 7 g/dL (ref 6.0–8.5)
Triglycerides: 238 mg/dL — ABNORMAL HIGH (ref 0–149)
Uric Acid: 5.3 mg/dL (ref 3.7–8.6)
VLDL Cholesterol Cal: 40 mg/dL (ref 5–40)
WBC: 7 10*3/uL (ref 3.4–10.8)

## 2019-07-29 LAB — HGB A1C W/O EAG: Hgb A1c MFr Bld: 8.9 % — ABNORMAL HIGH (ref 4.8–5.6)

## 2019-07-29 LAB — MICROALBUMIN / CREATININE URINE RATIO
Creatinine, Urine: 128.7 mg/dL
Microalb/Creat Ratio: 8 mg/g creat (ref 0–29)
Microalbumin, Urine: 10 ug/mL

## 2019-08-03 ENCOUNTER — Other Ambulatory Visit: Payer: Self-pay

## 2019-08-03 ENCOUNTER — Ambulatory Visit: Payer: Managed Care, Other (non HMO) | Admitting: Internal Medicine

## 2019-08-03 ENCOUNTER — Encounter: Payer: Self-pay | Admitting: Internal Medicine

## 2019-08-03 VITALS — BP 126/86 | HR 68 | Temp 97.2°F | Resp 16 | Ht 71.0 in | Wt 243.0 lb

## 2019-08-03 DIAGNOSIS — E038 Other specified hypothyroidism: Secondary | ICD-10-CM | POA: Insufficient documentation

## 2019-08-03 DIAGNOSIS — E119 Type 2 diabetes mellitus without complications: Secondary | ICD-10-CM | POA: Insufficient documentation

## 2019-08-03 DIAGNOSIS — Z6833 Body mass index (BMI) 33.0-33.9, adult: Secondary | ICD-10-CM | POA: Insufficient documentation

## 2019-08-03 DIAGNOSIS — D649 Anemia, unspecified: Secondary | ICD-10-CM

## 2019-08-03 DIAGNOSIS — I10 Essential (primary) hypertension: Secondary | ICD-10-CM

## 2019-08-03 DIAGNOSIS — E6609 Other obesity due to excess calories: Secondary | ICD-10-CM

## 2019-08-03 DIAGNOSIS — E7849 Other hyperlipidemia: Secondary | ICD-10-CM | POA: Insufficient documentation

## 2019-08-03 DIAGNOSIS — Z86010 Personal history of colonic polyps: Secondary | ICD-10-CM | POA: Insufficient documentation

## 2019-08-03 DIAGNOSIS — E039 Hypothyroidism, unspecified: Secondary | ICD-10-CM | POA: Insufficient documentation

## 2019-08-03 MED ORDER — SITAGLIPTIN PHOSPHATE 50 MG PO TABS: 50.0000 mg | ORAL_TABLET | Freq: Every day | ORAL | 3 refills | Status: DC

## 2019-08-03 NOTE — Progress Notes (Signed)
Aaron Smith  - 58 y.o male who presents for annual physical evaluation  His BP med was increased in June to two tabs in the morning. He also returned to two tabs bid of the metformin. He denies any increased fatigue, urination, thirst sx'Aaron Smith.  Remains on the statin as well for his lipids.  All in all, notes feeling good. No specific complaints, denies any recent CP, palpitations, SOB, abdominal pains, change in bowel habits, dark/black stools, vision changes (sees eye MD yearly as wears contacts), no recent fevers, or other Covid concerning sx'Aaron Smith, no LE swelling or joint pains, does not get up at all overnight to urinate, no hesitancy  Exercise - no regular exercise, very active daily as raises cattle  Diet - notes at times tries to watch and eat healthy, not adherent to a very healthy diet, especially recently when at the beach some  Meds reviewed Current Outpatient Medications on File Prior to Visit  Medication Sig Dispense Refill  . glipiZIDE (GLUCOTROL XL) 5 MG 24 hr tablet Take by mouth.    Marland Kitchen lisinopril-hydrochlorothiazide (PRINZIDE,ZESTORETIC) 20-12.5 MG tablet Take 1 tablet by mouth daily.    Marland Kitchen loratadine (CLARITIN) 10 MG tablet Take 10 mg by mouth daily.    . metFORMIN (GLUCOPHAGE-XR) 500 MG 24 hr tablet Take 1,000 mg by mouth 2 (two) times daily.    . Multiple Vitamin (MULTIVITAMIN) capsule Take by mouth.    . naproxen (NAPROSYN) 250 MG tablet Take by mouth 2 (two) times daily with a meal.    . sildenafil (REVATIO) 20 MG tablet Take by mouth.    . simvastatin (ZOCOR) 20 MG tablet      No current facility-administered medications on file prior to visit.     Allergies  Allergen Reactions  . Penicillins Other (See Comments)    Patient doesn't know what the reaction was. The last time medication was taken was approximately 1960.    Social History   Tobacco Use  Smoking Status Never Smoker  Smokeless Tobacco Never Used     FH - D - leukemia, B with CA  O - NAD, masked  BP 126/86  (BP Location: Right Arm, Patient Position: Bed low/side rails up, Cuff Size: Large)   Pulse 68   Temp (!) 97.2 F (36.2 C) (Oral)   Resp 16   Ht 5\' 11"  (1.803 m)   Wt 243 lb (110.2 kg)   SpO2 100%   BMI 33.89 kg/m    BP last visit - 135/91 Weight 238.4 in June, 229 Sept 2019  HEENT - sclera anicteric, PERRL, EOMI, conj - non-inj'ed, No sinus tenderness, TM'Aaron Smith and canals clear Neck - supple, no adenopathy, no TM, carotids 2+ and = without bruits bilat Car - RRR without m/g/r Pulm- CTA without wheeze or rales Abd - soft, mildly obese, NT, ND, BS+, no obvious HSM, no masses Back - no CVA tenderness Skin- no new lesions of concern on exposed areas, denied otherwise Ext - no LE edema, no active joints GU - no swelling in inguinal/suprapubic region, NT,  testicle and prostate exams deferred (without concerning sx'Aaron Smith and after discussion on current recommendations for prostate CA screening including PSA tests) Neuro - affect was not flat, appropriate with conversation  Grossly non-focal with good strength on testing, DTR'Aaron Smith 2+ and = patella, Romberg neg, no pronator drift, good balance on one foot, good finger to nose, good RAMs  Labs reviewed - urine + for glucose, , glucose - 209, A1C - 8.9, urine microalb ok,  kidney fcn - good, TG - 238, HDL - 33, LDL - 81 (73 in March), TSH - 5.480, PSA - 0.7, HGB 12.9 with normal indices  ECG reviewed - no concerning changes from prior ECG  Colonoscopy screening discussed and reviewed - last 2019 and inflammatory polyp noted (two polyps noted on one done prior), rec'ed f/u in 5 years  Ass/Plan: 1. DM - BS'Aaron Smith not as well controlled recent past with A1C higher, sugar in urine noted and FBS high  Educated on this and meds available to add to try to help better control Not desire an injectable, and agreed to try sitagliptin (Januvia) - 50 mg daily  Cont metformin - 2000 mg / day and the glipizide Continue with yearly eye MD evaluations as doing Diet  emphasized and he noted will try less pops and better diet modifications, also staying active important and weight management importance noted as well  2. HTN - controlled on medicines at present  Continue medications to manage (increased dose recently helpful) Discussed goals for good control of BP  Importance of healthy diet and regular aerobic exercise and weight control noted Continue to monitor  3.  Increased BMI/obesity  Importance of diet modifications and staying active emphasized Also trying to have weight decrease with no further increases important for the BP, DM and lipids   4.  Hyperlipidemia  statin to continue LDL goal < 70 Importance of diet modifications and staying active encouraged Above to help with weight control/weight loss also very important   5. Mild increase TSH - likely subtherapeutic hypothyroid  Educated Will continue to follow presently  6. Very mild decrease Hgb noted with normal RBC indices. Other counts (WBC, platelets) good. Recent colonoscopy in 2019 had one inflammatory polyp noted and no recent sx'Aaron Smith of concern with no black stools or BRBPR or increase in bleeding otherwise,   Continue to monitor and I noted with new medicine, best to check labs again in about three months and can add a CBC to the BMP and A1C plan to check in three months.  7. Colon polyp history - as above, next scope rec'ed 5 years after one completed in 2019  8. Still not have flu shots available in the office, will rec when are available  F/u in three months at the latest, and check a CBC, BMP and A1C then. F/u sooner prn

## 2019-08-18 ENCOUNTER — Other Ambulatory Visit: Payer: Self-pay | Admitting: Internal Medicine

## 2019-08-18 ENCOUNTER — Other Ambulatory Visit: Payer: Self-pay | Admitting: Registered Nurse

## 2019-08-18 ENCOUNTER — Encounter: Payer: Self-pay | Admitting: Internal Medicine

## 2019-08-18 NOTE — Telephone Encounter (Signed)
Last labs HgbA1c 8.9 and glucose 209 07/27/2019; last appt 08/03/2019 with Dr Roxan Hockey.  Patient had increased to two tabs metformin BID in June and BP med also increased to two tabs and on statin.  Sitagliptin 50mg  po daily added to his regimen at this appt noted not following healthy diet.  Labs due 10/26/2019 CBC, BMP and HgbA1c.  Bridge refill electronic Rx sent to his pharmacy of choice today 90 day supply.  Renal and liver function stable.  07/15/2018 HgbA1c 7.6

## 2019-10-18 ENCOUNTER — Ambulatory Visit: Payer: BLUE CROSS/BLUE SHIELD | Admitting: Urology

## 2019-10-25 ENCOUNTER — Ambulatory Visit: Payer: BLUE CROSS/BLUE SHIELD | Admitting: Urology

## 2019-11-03 ENCOUNTER — Other Ambulatory Visit: Payer: Self-pay

## 2019-11-03 DIAGNOSIS — E119 Type 2 diabetes mellitus without complications: Secondary | ICD-10-CM

## 2019-11-03 DIAGNOSIS — N529 Male erectile dysfunction, unspecified: Secondary | ICD-10-CM

## 2019-11-03 MED ORDER — METFORMIN HCL ER 500 MG PO TB24: 1000.0000 mg | ORAL_TABLET | Freq: Every day | ORAL | 2 refills | Status: DC

## 2019-11-03 MED ORDER — SILDENAFIL CITRATE 20 MG PO TABS: ORAL_TABLET | ORAL | 0 refills | Status: DC

## 2019-11-17 ENCOUNTER — Ambulatory Visit: Payer: Self-pay | Admitting: Registered Nurse

## 2019-11-17 ENCOUNTER — Encounter: Payer: Self-pay | Admitting: Registered Nurse

## 2019-11-17 ENCOUNTER — Other Ambulatory Visit: Payer: Self-pay

## 2019-11-17 VITALS — BP 124/82 | HR 60 | Temp 97.6°F | Resp 16 | Ht 71.0 in | Wt 241.0 lb

## 2019-11-17 DIAGNOSIS — E038 Other specified hypothyroidism: Secondary | ICD-10-CM

## 2019-11-17 DIAGNOSIS — I10 Essential (primary) hypertension: Secondary | ICD-10-CM

## 2019-11-17 DIAGNOSIS — E119 Type 2 diabetes mellitus without complications: Secondary | ICD-10-CM

## 2019-11-17 DIAGNOSIS — J301 Allergic rhinitis due to pollen: Secondary | ICD-10-CM

## 2019-11-17 DIAGNOSIS — E1169 Type 2 diabetes mellitus with other specified complication: Secondary | ICD-10-CM

## 2019-11-17 DIAGNOSIS — K439 Ventral hernia without obstruction or gangrene: Secondary | ICD-10-CM

## 2019-11-17 DIAGNOSIS — Z86010 Personal history of colonic polyps: Secondary | ICD-10-CM

## 2019-11-17 DIAGNOSIS — Z87442 Personal history of urinary calculi: Secondary | ICD-10-CM

## 2019-11-17 DIAGNOSIS — E6609 Other obesity due to excess calories: Secondary | ICD-10-CM

## 2019-11-17 DIAGNOSIS — E7849 Other hyperlipidemia: Secondary | ICD-10-CM

## 2019-11-17 DIAGNOSIS — D649 Anemia, unspecified: Secondary | ICD-10-CM

## 2019-11-17 DIAGNOSIS — E039 Hypothyroidism, unspecified: Secondary | ICD-10-CM

## 2019-11-17 DIAGNOSIS — N529 Male erectile dysfunction, unspecified: Secondary | ICD-10-CM

## 2019-11-17 DIAGNOSIS — F5109 Other insomnia not due to a substance or known physiological condition: Secondary | ICD-10-CM

## 2019-11-17 DIAGNOSIS — Z0000 Encounter for general adult medical examination without abnormal findings: Secondary | ICD-10-CM

## 2019-11-17 DIAGNOSIS — B351 Tinea unguium: Secondary | ICD-10-CM

## 2019-11-17 DIAGNOSIS — B353 Tinea pedis: Secondary | ICD-10-CM

## 2019-11-17 NOTE — Progress Notes (Signed)
Last appointment 08/03/2019 with Dr. Dorris Fetch.  States never started the Januvia 50 mg that was discussed at the last visit.  AMD

## 2019-11-17 NOTE — Progress Notes (Signed)
Subjective:    Patient ID: Aaron Smith, male    DOB: 1961/04/25, 59 y.o.   MRN: [id number]  58y/o established male married caucasian patient last seen by Dr Dorris Fetch 08/03/2019 and metformin increased and sitagliptan Rx given to patient.  Patient reported initially he has not been taking sitagliptan and was unaware new Rx given to his pharmacy but then he remembered there is a new round pill in his pill boxes.  His wife sets up his pills in pill minder container.  Unsure if he needs new Rx/refills at this time knows that metformin Rx was sent in end of December.  He has been taking his metformin 2 tabs BID wondering if could be changed to 1000mg  tab.  My last appt with patient 10/21/2018.  Patient overdue for annual fasting labs.  History of kidney stones calcium oxalate  Urinalysis today denied symptoms of kidney stones since Nov 2019.  Hypertension lisinopril hydrochlorothiazide dose increased to 40/25mg  by Dr Dec 2019 this summer.  On zocor for dyslipidemia. Using sildenafil but not frequently maybe refilled rx once per patient in past year working well for him.  Denied heartburn/chest pain/dyspnea/leg swelling/headaches/visual changes/n/v/d/sob/rash/diarrhea/constipation/hair loss/ear pain/sinus pain/allergy problems/weight gain or loss/bright red blood or black/coffee ground stools.  States 1 formed brown stool daily/he is regular CSP 2019 polyp noted next due 2024 TSH elevated at 5.48 07/2019.  Last Hgba1c 8.9.  Mild anemia H/h 12.9/39.2  HDL low 33 trigs elevated 06-14-1981 glucose 209..  Father's dementia progressing and has PCM/neurology appts this week both of his parents 80/81 and currently living independently but patient thinks he will need to help mother get assistance caring for his father this month.  Father fell 6 years ago required hip replacement after fracture and again 3 years ago damaged hip replacement and now using walker.  Patient having some insomnia due to anxiety/worrying about  parents.  He and wife are only children.  Refuses to get influenza vaccine as got very sick while active 924 within a couple hours of getting shot a few years ago.  Now "retired" working on Theatre stage manager.  Eye exam scheduled this month or next month can't remember exact date.  Follow up with urology/nephrology for kidney stone imaging/labs scheduled third week this month.       Review of Systems  Constitutional: Negative for activity change, appetite change, chills, diaphoresis, fatigue, fever and unexpected weight change.  HENT: Negative for trouble swallowing and voice change.   Eyes: Negative for photophobia, pain, discharge, redness, itching and visual disturbance.  Respiratory: Negative for cough, chest tightness, shortness of breath, wheezing and stridor.   Cardiovascular: Negative for chest pain, palpitations and leg swelling.  Gastrointestinal: Negative for abdominal distention, abdominal pain, blood in stool, constipation, diarrhea, nausea and vomiting.  Endocrine: Negative for cold intolerance, heat intolerance, polydipsia, polyphagia and polyuria.  Genitourinary: Negative for decreased urine volume, difficulty urinating, dysuria, flank pain, hematuria and scrotal swelling.  Musculoskeletal: Negative for arthralgias, back pain, gait problem, joint swelling, myalgias, neck pain and neck stiffness.  Skin: Negative for color change, pallor, rash and wound.  Allergic/Immunologic: Positive for environmental allergies and immunocompromised state. Negative for food allergies.  Neurological: Negative for dizziness, tremors, seizures, syncope, facial asymmetry, speech difficulty, weakness, light-headedness, numbness and headaches.  Hematological: Negative for adenopathy. Does not bruise/bleed easily.  Psychiatric/Behavioral: Negative for agitation, confusion and sleep disturbance.       Objective:   Physical Exam Vitals and nursing note reviewed.  Constitutional:  General: He is awake. He is not in acute distress.    Appearance: Normal appearance. He is well-developed and well-groomed. He is obese. He is not ill-appearing, toxic-appearing or diaphoretic.  HENT:     Head: Normocephalic and atraumatic.     Jaw: There is normal jaw occlusion. No trismus.     Salivary Glands: Right salivary gland is not diffusely enlarged or tender. Left salivary gland is not diffusely enlarged or tender.     Right Ear: Hearing, ear canal and external ear normal. A middle ear effusion is present. There is no impacted cerumen.     Left Ear: Hearing, ear canal and external ear normal. A middle ear effusion is present. There is no impacted cerumen.     Nose: Mucosal edema present. No nasal deformity, septal deviation, signs of injury, laceration, nasal tenderness, congestion or rhinorrhea.     Right Turbinates: Not enlarged, swollen or pale.     Left Turbinates: Not enlarged, swollen or pale.     Right Sinus: No maxillary sinus tenderness or frontal sinus tenderness.     Left Sinus: No maxillary sinus tenderness or frontal sinus tenderness.     Mouth/Throat:     Lips: Pink. No lesions.     Mouth: Mucous membranes are moist. Mucous membranes are not pale, not dry and not cyanotic. No injury, lacerations, oral lesions or angioedema.     Dentition: No gingival swelling, dental abscesses or gum lesions.     Tongue: No lesions.     Palate: No mass and lesions.     Pharynx: Uvula midline. Pharyngeal swelling and posterior oropharyngeal erythema present. No oropharyngeal exudate or uvula swelling.     Tonsils: No tonsillar exudate or tonsillar abscesses.     Comments: Cobblestoning posterior pharynx; bilateral TMs air fluid level clear; bilateral allergic shiners Eyes:     General: Lids are normal. Vision grossly intact. Gaze aligned appropriately. Allergic shiner present. No visual field deficit or scleral icterus.       Right eye: No foreign body, discharge or hordeolum.         Left eye: No foreign body, discharge or hordeolum.     Extraocular Movements: Extraocular movements intact.     Right eye: Normal extraocular motion and no nystagmus.     Left eye: Normal extraocular motion and no nystagmus.     Conjunctiva/sclera: Conjunctivae normal.     Right eye: Right conjunctiva is not injected. No chemosis, exudate or hemorrhage.    Left eye: Left conjunctiva is not injected. No chemosis, exudate or hemorrhage.    Pupils: Pupils are equal, round, and reactive to light. Pupils are equal.     Right eye: Pupil is round and reactive.     Left eye: Pupil is round and reactive.  Neck:     Thyroid: No thyroid mass, thyromegaly or thyroid tenderness.     Trachea: Trachea and phonation normal. No tracheal tenderness or tracheal deviation.  Cardiovascular:     Rate and Rhythm: Normal rate and regular rhythm.     Chest Wall: PMI is not displaced.     Pulses: Normal pulses.          Radial pulses are 2+ on the right side and 2+ on the left side.       Dorsalis pedis pulses are 2+ on the right side and 2+ on the left side.       Posterior tibial pulses are 2+ on the right side and 2+ on the left  side.     Heart sounds: Normal heart sounds, S1 normal and S2 normal. No murmur. No friction rub. No gallop.   Pulmonary:     Effort: Pulmonary effort is normal. No respiratory distress.     Breath sounds: Normal breath sounds and air entry. No stridor, decreased air movement or transmitted upper airway sounds. No decreased breath sounds, wheezing, rhonchi or rales.     Comments: Wearing cloth mask due to covid 19 pandemic; spoke full sentences without difficulty; no cough noted in exam room Abdominal:     General: Abdomen is flat. Bowel sounds are decreased. There is no distension or abdominal bruit. There are no signs of injury.     Palpations: Abdomen is soft. There is no shifting dullness, fluid wave, hepatomegaly, splenomegaly, mass or pulsatile mass.     Tenderness: There is no  abdominal tenderness. There is no right CVA tenderness, left CVA tenderness, guarding or rebound. Negative signs include Murphy's sign.     Hernia: A hernia is present. Hernia is present in the ventral area. There is no hernia in the umbilical area.       Comments: Dull to percussion x 4 quads;   Musculoskeletal:        General: No swelling, tenderness, deformity or signs of injury. Normal range of motion.     Cervical back: Normal range of motion and neck supple. No edema, erythema, signs of trauma, rigidity, torticollis, tenderness or crepitus. No pain with movement, spinous process tenderness or muscular tenderness. Normal range of motion.     Right lower leg: No edema.     Left lower leg: No edema.     Right foot: Normal range of motion. No deformity, bunion, Charcot foot or foot drop.     Left foot: Normal range of motion. No deformity, bunion, Charcot foot or foot drop.  Feet:     Right foot:     Protective Sensation: 6 sites tested. 5 sites sensed.     Skin integrity: Erythema, callus and dry skin present. No ulcer or blister.     Toenail Condition: Right toenails are abnormally thick. Fungal disease present.    Left foot:     Protective Sensation: 6 sites tested. 5 sites sensed.     Skin integrity: Erythema, callus and dry skin present. No ulcer or blister.     Toenail Condition: Left toenails are abnormally thick. Fungal disease present. Lymphadenopathy:     Head:     Right side of head: No submental, submandibular, tonsillar, preauricular, posterior auricular or occipital adenopathy.     Left side of head: No submental, submandibular, tonsillar, preauricular, posterior auricular or occipital adenopathy.     Cervical: No cervical adenopathy.     Right cervical: No superficial, deep or posterior cervical adenopathy.    Left cervical: No superficial, deep or posterior cervical adenopathy.  Skin:    General: Skin is warm and dry.     Capillary Refill: Capillary refill takes less  than 2 seconds.     Coloration: Skin is not ashen, cyanotic, jaundiced, mottled, pale or sallow.     Findings: Rash present. No abrasion, abscess, acne, bruising, burn, ecchymosis, erythema, signs of injury, laceration, lesion, petechiae or wound. Rash is macular and scaling. Rash is not crusting, nodular, papular, purpuric, pustular, urticarial or vesicular.     Nails: There is no clubbing.  Neurological:     General: No focal deficit present.     Mental Status: He is alert and oriented  to person, place, and time. Mental status is at baseline.     GCS: GCS eye subscore is 4. GCS verbal subscore is 5. GCS motor subscore is 6.     Cranial Nerves: Cranial nerves are intact. No cranial nerve deficit, dysarthria or facial asymmetry.     Sensory: Sensation is intact. No sensory deficit.     Motor: Motor function is intact. No weakness, tremor, atrophy, abnormal muscle tone or seizure activity.     Coordination: Coordination is intact. Coordination normal.     Gait: Gait is intact. Gait normal.     Comments: Gait sure and steady in hallway; on/off exam table without difficulty; bilateral hand grasp/upper and lower extremity strength equal 5/5  Psychiatric:        Attention and Perception: Attention and perception normal.        Mood and Affect: Mood and affect normal.        Speech: Speech normal.        Behavior: Behavior normal. Behavior is cooperative.        Thought Content: Thought content normal.        Cognition and Memory: Cognition and memory normal.        Judgment: Judgment normal.     Ventral hernia 2x4cm epigastric with abdominal crunch/nontender/spontaneously resolves at rest in supine position Allergic shiners; cobblestoning; bilateral TMs air fluid level Hypoactive bowel sounds and dull to percussion x 4 quads Fissues/callouses/scaling moccasin foot bilaterally dry no maceration between toes; slight erythema macular adjacent to scaling on sides of feet; no discharge no  fluctuance no tenderness to palpation; patient could not discern sensory check midfoot where callouses very thick on diabetic foot exam Thickened opaque white and yellow scaling distal ends toenails bilaterally 1-4; normal mid nail to growth plate clear healthy     Assessment & Plan:  A-annual physical, type 2 diabetes, hypertension, dyslipidemia, obesity BMI 33.6, history nephrolithiasis, tinea pedis and onychomycosis toenails, seasonal allergic rhinitis, subclinical hypothyroidism, situational insomnia, erectile dysfunction, borderline anemia, history of colonic polyps  P-Vitamin D level has never been checked.  Covid 19 pandemic patient at increased risk due to age/diabetes/hypertension and if low Vitamin D levels worse outcomes with covid infection can be more severe per current research.  Next physical in 1 year.  Fasting male exec panel last completed Sep 2020 results reviewed with patient.  Elevated TSH Sep 2020 will repeat with quarterly HgbA1c.    Due quarterly Hgba1c today also. Hypertension controlled electronic Rx to his pharmacy of choice lisinopril hydrochlorothiazide 20/12.5mg  po daily #90 RF3 .  New tinea pedis/onychomycosis will treat with otc at this time and if no improvement podiatry referral.  Continue statin for dyslipidemia zocor 20mg  po daily LDL 81 (low HDL and high trigs now unexpected with elevated HgbA1c). New ventral hernia on exam today continue to monitor and if not spontaneously resolving/abdomen pain/n/v/d/bloody or black stools seek same day re-evaluation with a provider.  Exitcare handout on ventral hernia.  Patient verbalized understanding information/instructions, agreed with plan of care and had no further questions at this time.   Last HgbA1c 8.9 not at goal was started on Januvia by Dr Dorris Fetch.  Pending repeat HgbA1c ordered today and patient to schedule  Per epocrates when on metformin 4 years or greater B12 level to be checked.  Fasting male exec panel, B12 and  microalbumin ordered today.  May be taking Januvia knows small round pill in his pill minder and he didn't know what they were for exactly  but when viewing pill ID app on my phone he has seen [place] pill before.  His wife sets up his weekly pill boxes and he does not look at his pill bottles. Rx current for Januvia 50mg  po daily #90 Rf3 from Dr Sep 2020.  Electronic Rx to his pharmacy of choice glipizide 5mg  po daily #90 RF0 and metformin 500mg  take 2 tabs BID #120 RF2 pending HgbA1c quarterly results. Wondering why he needs to take two 500mg  metformin, I looked up cash cost and 500mg  cheaper than 1000mg  tabs.  Patient to ask pharmacist if with his insurance which is cheaper and notify Oct 2020 if cheaper with insurance for 1000mg  tabs.  Hgba1c in 3 months.  Eye exam scheduled for next 30 days but unsure of date.  Foot exam completed today-decreased sensation with callouses midfoot and has active tinea pedis and onychomycosis.  Start antifungal foot powder in boots and spray on feet; change socks if sweat soaked midday rotate footwear to allow dry out thoroughly between wear.  Open shoelaces and place in sun/UV light helps to kill fungus and dry out boots or use boot dryer.  Moisturize feet with vaseline/eucerin/aquaphor after bath/shower daily.  Repeat exam in 3 months to see if regimen has helped to decrease skin/nail infection if not will consider starting lamisil oral.  Exitcare handout athletes foot, diabetic foot care, dash diet, managing hypertension, preventing health problems of overweight.  Continue zocor 20mg  po daily.  Patient verbalized understanding information/instructions, agreed with plan of care and had no further questions at this time.  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  TSH was elevated 2020 could be related to weight gain also.  Will recheck with next HgbA1c draw.  Watch his po intake of  non-nutritional foods e.g. chips/crackers/cake/cookies/etc.  Encouraged exercise 150 minutes per week and weight loss.  Patient verbalized understanding information/instructions, agreed with plan of care and had no further questions at this time.  Has urology appt 11/29/2019 Dr for imaging and tests f/u nephrolithiasis.  Denied any symptoms kidney stones since 09/2018 office visit  Discussed keep appt; avoid dehydration.  Patient verbalized understanding information/instructions, agreed with plan of care and had no further questions at this time.  Last colonoscopy 2019 next due 2024 due to inflammatory polyp  Patient reminded verbalized understanding and had no further questions at this time.  Erectile dysfunction stable.  Prn use oral sildenafil 20mg  working well for him; rx last filled by PA Smith Dec 2020 90 tabs RF0  Will refill for patient when he needs electronically.  Patient verbalized understanding information/instructions, agreed with plan of care and had no further questions at this time.  Situational anxiety father with worsening dementia and working with PCM/specialist and mother to get his father and mother assistance to care for father at this time to allow him to stay in own home.  Patient only child and stressor to see his parents age/have worsening health problems during covid pandemic.  No sleep aids needed at this time.  Patient has plan to get help for parents with insurance/PCM/specialist.  Patient to follow up prn.  Denied HI/SI.  Encouraged exercise and regular bedtime/wake up.  Exitcare handout insomnia  Patient verbalized understanding information/instructions, agreed with plan of care and had no further questions at this time.  Patient may use normal saline nasal spray 2 sprays each nostril q2h wa as needed given 1  bottle from clinic stock patient to restart use and start showering before bedtime instead of am to wash off dust and clear out nasal passages with saline in  shower.  Consider flonase 50mcg 1 spray each nostril BID if no relief with claritin 10mg  po daily (restart consistent use) electronic Rx to his pharmacy of choice #90 RF3.  Patient denied personal or family history of ENT cancer.  OTC antihistamine of choice claritin 10mg  po daily.  Avoid triggers if possible.  Shower prior to bedtime if exposed to triggers.  If allergic dust/dust mites recommend mattress/pillow covers/encasements; washing linens, vacuuming, sweeping, dusting weekly.  Call or return to clinic as needed if these symptoms worsen or fail to improve as anticipated.   Exitcare handout on allergic rhinitis and sinus rinse.  Patient verbalized understanding of instructions, agreed with plan of care and had no further questions at this time.  P2:  Avoidance and hand washing.  Repeat CBC due to slight anemia Sep 2020 labs.  Patient denied bowel changes bright red or black stools.  PMHx colonic polyps next CSP due 2024.  Iron level was normal.  Patient to continue to monitor for stool changes he states regular daily and brown formed at this time.  Patient verbalized understanding information/instructions, agreed with plan of care and had no further questions at this time.  Addendum see results note patient contacted via telephone and discussed lab results from yesterday with patient.  He will repeat TSH and Hgba1C and dipstick urinalysis in 3 months.  Labs stable/normal or improved from Sep 2020 results.  Glipizide Rx discontinued as patient reported after discussing med list with spouse he stopped glipizide in Sep 2020 when he started [place].  If HgbA1c worsens next quarter check (March 2021) will increase Januvia to 100mg  po daily.   Copied result note:  "Patient contacted via telephone and discussed lab results with him. HgbA1c improved but still not at goal. Patient reported his diet had not been the best over the holidays and he found out from his wife he was no longer taking glipizide after  starting januvia. Patient preferred to modify diet this quarter and as long as HgbA1c improves closer to 7.0 goal to keep Januvia at same dose, continue metformin 1000mg  po BID and still not take glipizide. elevated triglycerides and low HDL (improved from previous, discussed with patient with glucose elevated triglycerides will be elevated and HDL typically lowered), elevated blood sugar improved from 209 in Sep 2020. TSH still elevated but improved from 5.4 to 4.9 discussed medication typically not recommended if asymptomatic and TSH less than 10. Discussed autoimmune diseases occur more frequently together and will need to continue to monitor his thyroid function. Consider thyroid antibodies if TSH worsening despite better glucose control and weight loss. I recommend HgbA1c and TSH repeat in 3 months sooner if new or worsening symptoms. Patient will call clinic and schedule lab draw for 3 months as I am not onsite today. I recommend weight loss, exercise 150 minutes per week; dietary fiber 30 grams per day men; eat whole grains/fruits/vegetables; keep added sugars to less than 150 calories/9 teaspoons for men per [employer]; prostate, electrolytes, iron, kidney/liver function, microalbumin/creatinine ratio, B12, Vitamin D and complete blood count normal" Patient reported spouse was buying antifungal powder and athlete's foot spray for him today and he will start use tomorrow along with emollient application to feet after shower daily.  Reitered with patient will recheck feet in 3 months and if rash improved/resolved will  not need lamisil therapy.  Toenails sometimes take up to one year to grow out/fungal infection to resolve.  Patient verbalized understanding of information/agreed with plan of care and had no further questions at this time.

## 2019-11-17 NOTE — Patient Instructions (Addendum)
Insomnia Insomnia is a sleep disorder that makes it difficult to fall asleep or stay asleep. Insomnia can cause fatigue, low energy, difficulty concentrating, mood swings, and poor performance at work or school. There are three different ways to classify insomnia:  Difficulty falling asleep.  Difficulty staying asleep.  Waking up too early in the morning. Any type of insomnia can be long-term (chronic) or short-term (acute). Both are common. Short-term insomnia usually lasts for three months or less. Chronic insomnia occurs at least three times a week for longer than three months. What are the causes? Insomnia may be caused by another condition, situation, or substance, such as:  Anxiety.  Certain medicines.  Gastroesophageal reflux disease (GERD) or other gastrointestinal conditions.  Asthma or other breathing conditions.  Restless legs syndrome, sleep apnea, or other sleep disorders.  Chronic pain.  Menopause.  Stroke.  Abuse of alcohol, tobacco, or illegal drugs.  Mental health conditions, such as depression.  Caffeine.  Neurological disorders, such as Alzheimer's disease.  An overactive thyroid (hyperthyroidism). Sometimes, the cause of insomnia may not be known. What increases the risk? Risk factors for insomnia include:  Gender. Women are affected more often than men.  Age. Insomnia is more common as you get older.  Stress.  Lack of exercise.  Irregular work schedule or working night shifts.  Traveling between different time zones.  Certain medical and mental health conditions. What are the signs or symptoms? If you have insomnia, the main symptom is having trouble falling asleep or having trouble staying asleep. This may lead to other symptoms, such as:  Feeling fatigued or having low energy.  Feeling nervous about going to sleep.  Not feeling rested in the morning.  Having trouble concentrating.  Feeling irritable, anxious, or depressed. How  is this diagnosed? This condition may be diagnosed based on:  Your symptoms and medical history. Your health care provider may ask about: ? Your sleep habits. ? Any medical conditions you have. ? Your mental health.  A physical exam. How is this treated? Treatment for insomnia depends on the cause. Treatment may focus on treating an underlying condition that is causing insomnia. Treatment may also include:  Medicines to help you sleep.  Counseling or therapy.  Lifestyle adjustments to help you sleep better. Follow these instructions at home: Eating and drinking   Limit or avoid alcohol, caffeinated beverages, and cigarettes, especially close to bedtime. These can disrupt your sleep.  Do not eat a large meal or eat spicy foods right before bedtime. This can lead to digestive discomfort that can make it hard for you to sleep. Sleep habits   Keep a sleep diary to help you and your health care provider figure out what could be causing your insomnia. Write down: ? When you sleep. ? When you wake up during the night. ? How well you sleep. ? How rested you feel the next day. ? Any side effects of medicines you are taking. ? What you eat and drink.  Make your bedroom a dark, comfortable place where it is easy to fall asleep. ? Put up shades or blackout curtains to block light from outside. ? Use a white noise machine to block noise. ? Keep the temperature cool.  Limit screen use before bedtime. This includes: ? Watching TV. ? Using your smartphone, tablet, or computer.  Stick to a routine that includes going to bed and waking up at the same times every day and night. This can help you fall asleep faster. Consider   making a quiet activity, such as reading, part of your nighttime routine.  Try to avoid taking naps during the day so that you sleep better at night.  Get out of bed if you are still awake after 15 minutes of trying to sleep. Keep the lights down, but try reading or  doing a quiet activity. When you feel sleepy, go back to bed. General instructions  Take over-the-counter and prescription medicines only as told by your health care provider.  Exercise regularly, as told by your health care provider. Avoid exercise starting several hours before bedtime.  Use relaxation techniques to manage stress. Ask your health care provider to suggest some techniques that may work well for you. These may include: ? Breathing exercises. ? Routines to release muscle tension. ? Visualizing peaceful scenes.  Make sure that you drive carefully. Avoid driving if you feel very sleepy.  Keep all follow-up visits as told by your health care provider. This is important. Contact a health care provider if:  You are tired throughout the day.  You have trouble in your daily routine due to sleepiness.  You continue to have sleep problems, or your sleep problems get worse. Get help right away if:  You have serious thoughts about hurting yourself or someone else. If you ever feel like you may hurt yourself or others, or have thoughts about taking your own life, get help right away. You can go to your nearest emergency department or call:  Your local emergency services (911 in the [place]  A suicide crisis helpline, such as the National Suicide Prevention Lifeline at 832-688-16461-262-877-0962. This is open 24 hours a day. Summary  Insomnia is a sleep disorder that makes it difficult to fall asleep or stay asleep.  Insomnia can be long-term (chronic) or short-term (acute).  Treatment for insomnia depends on the cause. Treatment may focus on treating an underlying condition that is causing insomnia.  Keep a sleep diary to help you and your health care provider figure out what could be causing your insomnia. This information is not intended to replace advice given to you by your health care provider. Make sure you discuss any questions you have with your health care provider. Document  Revised: 10/10/2017 Document Reviewed: 08/07/2017 Elsevier Patient Education  2020 [employer] Ventral Hernia  A ventral hernia is a bulge of tissue from inside the abdomen that pushes through a weak area of the muscles that form the front wall of the abdomen. The tissues inside the abdomen are inside a sac (peritoneum). These tissues include the small intestine, large intestine, and the fatty tissue that covers the intestines (omentum). Sometimes, the bulge that forms a hernia contains intestines. Other hernias contain only fat. Ventral hernias do not go away without surgical treatment. There are several types of ventral hernias. You may have:  A hernia at an incision site from previous abdominal surgery (incisional hernia).  A hernia just above the belly button (epigastric hernia), or at the belly button (umbilical hernia). These types of hernias can develop from heavy lifting or straining.  A hernia that comes and goes (reducible hernia). It may be visible only when you lift or strain. This type of hernia can be pushed back into the abdomen (reduced).  A hernia that traps abdominal tissue inside the hernia (incarcerated hernia). This type of hernia does not reduce.  A hernia that cuts off blood flow to the tissues inside the hernia (strangulated hernia). The tissues can start to die if this happens.  This is a very painful bulge that cannot be reduced. A strangulated hernia is a medical emergency. What are the causes? This condition is caused by abdominal tissue putting pressure on an area of weakness in the abdominal muscles. What increases the risk? The following factors may make you more likely to develop this condition:  Being male.  Being 17 or older.  Being overweight or obese.  Having had previous abdominal surgery, especially if there was an infection after surgery.  Having had an injury to the abdominal wall.  Having had several pregnancies.  Having a buildup of fluid  inside the abdomen (ascites). What are the signs or symptoms? The only symptom of a ventral hernia may be a painless bulge in the abdomen. A reducible hernia may be visible only when you strain, cough, or lift. Other symptoms may include:  Dull pain.  A feeling of pressure. Signs and symptoms of a strangulated hernia may include:  Increasing pain.  Nausea and vomiting.  Pain when pressing on the hernia.  The skin over the hernia turning red or purple.  Constipation.  Blood in the stool (feces). How is this diagnosed? This condition may be diagnosed based on:  Your symptoms.  Your medical history.  A physical exam. You may be asked to cough or strain while standing. These actions increase the pressure inside your abdomen and force the hernia through the opening in your muscles. Your health care provider may try to reduce the hernia by pressing on it.  Imaging studies, such as an ultrasound or CT scan. How is this treated? This condition is treated with surgery. If you have a strangulated hernia, surgery is done as soon as possible. If your hernia is small and not incarcerated, you may be asked to lose some weight before surgery. Follow these instructions at home:  Follow instructions from your health care provider about eating or drinking restrictions.  If you are overweight, your health care provider may recommend that you increase your activity level and eat a healthier diet.  Do not lift anything that is heavier than 10 lb (4.5 kg).  Return to your normal activities as told by your health care provider. Ask your health care provider what activities are safe for you. You may need to avoid activities that increase pressure on your hernia.  Take over-the-counter and prescription medicines only as told by your health care provider.  Keep all follow-up visits as told by your health care provider. This is important. Contact a health care provider if:  Your hernia gets  larger.  Your hernia becomes painful. Get help right away if:  Your hernia becomes increasingly painful.  You have pain along with any of the following: ? Changes in skin color in the area of the hernia. ? Nausea. ? Vomiting. ? Fever. Summary  A ventral hernia is a bulge of tissue from inside the abdomen that pushes through a weak area of the muscles that form the front wall of the abdomen.  This condition is treated with surgery, which may be urgent depending on your hernia.  Do not lift anything that is heavier than 10 lb (4.5 kg), and follow activity instructions from your health care provider. This information is not intended to replace advice given to you by your health care provider. Make sure you discuss any questions you have with your health care provider. Document Revised: 12/10/2017 Document Reviewed: 05/19/2017 Elsevier Patient Education  2020 [employer] Preventing High Cholesterol Cholesterol is a white, waxy  substance similar to fat that the human body needs to help build cells. The liver makes all the cholesterol that a person's body needs. Having high cholesterol (hypercholesterolemia) increases a person's risk for heart disease and stroke. Extra (excess) cholesterol comes from the food the person eats. High cholesterol can often be prevented with diet and lifestyle changes. If you already have high cholesterol, you can control it with diet and lifestyle changes and with medicine. How can high cholesterol affect me? If you have high cholesterol, deposits (plaques) may build up on the walls of your arteries. The arteries are the blood vessels that carry blood away from your heart. Plaques make the arteries narrower and stiffer. This can limit or block blood flow and cause blood clots to form. Blood clots:  Are tiny balls of cells that form in your blood.  Can move to the heart or brain, causing a heart attack or stroke. Plaques in arteries greatly increase your risk  for heart attack and stroke.Making diet and lifestyle changes can reduce your risk for these conditions that may threaten your life. What can increase my risk? This condition is more likely to develop in people who:  Eat foods that are high in saturated fat or cholesterol. Saturated fat is mostly found in: ? Foods that contain animal fat, such as red meat and some dairy products. ? Certain fatty foods made from plants, such as tropical oils.  Are overweight.  Are not getting enough exercise.  Have a family history of high cholesterol. What actions can I take to prevent this? Nutrition   Eat less saturated fat.  Avoid trans fats (partially hydrogenated oils). These are often found in margarine and in some baked goods, fried foods, and snacks bought in packages.  Avoid precooked or cured meat, such as sausages or meat loaves.  Avoid foods and drinks that have added sugars.  Eat more fruits, vegetables, and whole grains.  Choose healthy sources of protein, such as fish, poultry, lean cuts of red meat, beans, peas, lentils, and nuts.  Choose healthy sources of fat, such as: ? Nuts. ? Vegetable oils, especially olive oil. ? Fish that have healthy fats (omega-3 fatty acids), such as mackerel or salmon. The items listed above may not be a complete list of recommended foods and beverages. Contact a dietitian for more information. Lifestyle  Lose weight if you are overweight. Losing 5-10 lb (2.3-4.5 kg) can help prevent or control high cholesterol. It can also lower your risk for diabetes and high blood pressure. Ask your health care provider to help you with a diet and exercise plan to lose weight safely.  Do not use any products that contain nicotine or tobacco, such as cigarettes, e-cigarettes, and chewing tobacco. If you need help quitting, ask your health care provider.  Limit your alcohol intake. ? Do not drink alcohol if:  Your health care provider tells you not to  drink.  You are pregnant, may be pregnant, or are planning to become pregnant. ? If you drink alcohol:  Limit how much you use to:  0-1 drink a day for women.  0-2 drinks a day for men.  Be aware of how much alcohol is in your drink. In the [place] one drink equals one 12 oz bottle of beer (355 mL), one 5 oz glass of wine (148 mL), or one 1 oz glass of hard liquor (44 mL). Activity   Get enough exercise. Each week, do at least 150 minutes of exercise that takes  a medium level of effort (moderate-intensity exercise). ? This is exercise that:  Makes your heart beat faster and makes you breathe harder than usual.  Allows you to still be able to talk. ? You could exercise in short sessions several times a day or longer sessions a few times a week. For example, on 5 days each week, you could walk fast or ride your bike 3 times a day for 10 minutes each time.  Do exercises as told by your health care provider. Medicines  In addition to diet and lifestyle changes, your health care provider may recommend medicines to help lower cholesterol. This may be a medicine to lower the amount of cholesterol your liver makes. You may need medicine if: ? Diet and lifestyle changes do not lower your cholesterol enough. ? You have high cholesterol and other risk factors for heart disease or stroke.  Take over-the-counter and prescription medicines only as told by your health care provider. General information  Manage your risk factors for high cholesterol. Talk with your health care provider about all your risk factors and how to lower your risk.  Manage other conditions that you have, such as diabetes or high blood pressure (hypertension).  Have blood tests to check your cholesterol levels at regular points in time as told by your health care provider.  Keep all follow-up visits as told by your health care provider. This is important. Where to find more information  American Heart Association:  www.heart.org  National Heart, Lung, and Blood Institute: PopSteam.is Summary  High cholesterol increases your risk for heart disease and stroke. By keeping your cholesterol level low, you can reduce your risk for these conditions.  High cholesterol can often be prevented with diet and lifestyle changes.  Work with your health care provider to manage your risk factors, and have your blood tested regularly. This information is not intended to replace advice given to you by your health care provider. Make sure you discuss any questions you have with your health care provider. Document Revised: 02/19/2019 Document Reviewed: 07/06/2016 Elsevier Patient Education  2020 [employer]. DASH Eating Plan DASH stands for "Dietary Approaches to Stop Hypertension." The DASH eating plan is a healthy eating plan that has been shown to reduce high blood pressure (hypertension). It may also reduce your risk for type 2 diabetes, heart disease, and stroke. The DASH eating plan may also help with weight loss. What are tips for following this plan?  General guidelines  Avoid eating more than 2,300 mg (milligrams) of salt (sodium) a day. If you have hypertension, you may need to reduce your sodium intake to 1,500 mg a day.  Limit alcohol intake to no more than 1 drink a day for nonpregnant women and 2 drinks a day for men. One drink equals 12 oz of beer, 5 oz of wine, or 1 oz of hard liquor.  Work with your health care provider to maintain a healthy body weight or to lose weight. Ask what an ideal weight is for you.  Get at least 30 minutes of exercise that causes your heart to beat faster (aerobic exercise) most days of the week. Activities may include walking, swimming, or biking.  Work with your health care provider or diet and nutrition specialist (dietitian) to adjust your eating plan to your individual calorie needs. Reading food labels   Check food labels for the amount of sodium per serving.  Choose foods with less than 5 percent of the Daily Value of sodium. Generally,  foods with less than 300 mg of sodium per serving fit into this eating plan.  To find whole grains, look for the word "whole" as the first word in the ingredient list. Shopping  Buy products labeled as "low-sodium" or "no salt added."  Buy fresh foods. Avoid canned foods and premade or frozen meals. Cooking  Avoid adding salt when cooking. Use salt-free seasonings or herbs instead of table salt or sea salt. Check with your health care provider or pharmacist before using salt substitutes.  Do not fry foods. Cook foods using healthy methods such as baking, boiling, grilling, and broiling instead.  Cook with heart-healthy oils, such as olive, canola, soybean, or sunflower oil. Meal planning  Eat a balanced diet that includes: ? 5 or more servings of fruits and vegetables each day. At each meal, try to fill half of your plate with fruits and vegetables. ? Up to 6-8 servings of whole grains each day. ? Less than 6 oz of lean meat, poultry, or fish each day. A 3-oz serving of meat is about the same size as a deck of cards. One egg equals 1 oz. ? 2 servings of low-fat dairy each day. ? A serving of nuts, seeds, or beans 5 times each week. ? Heart-healthy fats. Healthy fats called Omega-3 fatty acids are found in foods such as flaxseeds and coldwater fish, like sardines, salmon, and mackerel.  Limit how much you eat of the following: ? Canned or prepackaged foods. ? Food that is high in trans fat, such as fried foods. ? Food that is high in saturated fat, such as fatty meat. ? Sweets, desserts, sugary drinks, and other foods with added sugar. ? Full-fat dairy products.  Do not salt foods before eating.  Try to eat at least 2 vegetarian meals each week.  Eat more home-cooked food and less restaurant, buffet, and fast food.  When eating at a restaurant, ask that your food be prepared with less salt or no salt,  if possible. What foods are recommended? The items listed may not be a complete list. Talk with your dietitian about what dietary choices are best for you. Grains Whole-grain or whole-wheat bread. Whole-grain or whole-wheat pasta. Brown rice. Orpah Cobb. Bulgur. Whole-grain and low-sodium cereals. Pita bread. Low-fat, low-sodium crackers. Whole-wheat flour tortillas. Vegetables Fresh or frozen vegetables (raw, steamed, roasted, or grilled). Low-sodium or reduced-sodium tomato and vegetable juice. Low-sodium or reduced-sodium tomato sauce and tomato paste. Low-sodium or reduced-sodium canned vegetables. Fruits All fresh, dried, or frozen fruit. Canned fruit in natural juice (without added sugar). Meat and other protein foods Skinless chicken or [place]. Ground chicken or [place]. Pork with fat trimmed off. Fish and seafood. Egg whites. Dried beans, peas, or lentils. Unsalted nuts, nut butters, and seeds. Unsalted canned beans. Lean cuts of beef with fat trimmed off. Low-sodium, lean deli meat. Dairy Low-fat (1%) or fat-free (skim) milk. Fat-free, low-fat, or reduced-fat cheeses. Nonfat, low-sodium ricotta or cottage cheese. Low-fat or nonfat yogurt. Low-fat, low-sodium cheese. Fats and oils Soft margarine without trans fats. Vegetable oil. Low-fat, reduced-fat, or light mayonnaise and salad dressings (reduced-sodium). Canola, safflower, olive, soybean, and sunflower oils. Avocado. Seasoning and other foods Herbs. Spices. Seasoning mixes without salt. Unsalted popcorn and pretzels. Fat-free sweets. What foods are not recommended? The items listed may not be a complete list. Talk with your dietitian about what dietary choices are best for you. Grains Baked goods made with fat, such as croissants, muffins, or some breads. Dry pasta or rice meal  packs. Vegetables Creamed or fried vegetables. Vegetables in a cheese sauce. Regular canned vegetables (not low-sodium or reduced-sodium). Regular canned  tomato sauce and paste (not low-sodium or reduced-sodium). Regular tomato and vegetable juice (not low-sodium or reduced-sodium). Rosita Fire. Olives. Fruits Canned fruit in a light or heavy syrup. Fried fruit. Fruit in cream or butter sauce. Meat and other protein foods Fatty cuts of meat. Ribs. Fried meat. Tomasa Blase. Sausage. Bologna and other processed lunch meats. Salami. Fatback. Hotdogs. Bratwurst. Salted nuts and seeds. Canned beans with added salt. Canned or smoked fish. Whole eggs or egg yolks. Chicken or [place] with skin. Dairy Whole or 2% milk, cream, and half-and-half. Whole or full-fat cream cheese. Whole-fat or sweetened yogurt. Full-fat cheese. Nondairy creamers. Whipped toppings. Processed cheese and cheese spreads. Fats and oils Butter. Stick margarine. Lard. Shortening. Ghee. Bacon fat. Tropical oils, such as coconut, palm kernel, or palm oil. Seasoning and other foods Salted popcorn and pretzels. Onion salt, garlic salt, seasoned salt, table salt, and sea salt. Worcestershire sauce. Tartar sauce. Barbecue sauce. Teriyaki sauce. Soy sauce, including reduced-sodium. Steak sauce. Canned and packaged gravies. Fish sauce. Oyster sauce. Cocktail sauce. Horseradish that you find on the shelf. Ketchup. Mustard. Meat flavorings and tenderizers. Bouillon cubes. Hot sauce and Tabasco sauce. Premade or packaged marinades. Premade or packaged taco seasonings. Relishes. Regular salad dressings. Where to find more information:  National Heart, Lung, and Blood Institute: PopSteam.is  American Heart Association: www.heart.org Summary  The DASH eating plan is a healthy eating plan that has been shown to reduce high blood pressure (hypertension). It may also reduce your risk for type 2 diabetes, heart disease, and stroke.  With the DASH eating plan, you should limit salt (sodium) intake to 2,300 mg a day. If you have hypertension, you may need to reduce your sodium intake to 1,500 mg a day.  When  on the DASH eating plan, aim to eat more fresh fruits and vegetables, whole grains, lean proteins, low-fat dairy, and heart-healthy fats.  Work with your health care provider or diet and nutrition specialist (dietitian) to adjust your eating plan to your individual calorie needs. This information is not intended to replace advice given to you by your health care provider. Make sure you discuss any questions you have with your health care provider. Document Revised: 10/10/2017 Document Reviewed: 10/21/2016 Elsevier Patient Education  2020 [employer]. Managing Your Hypertension Hypertension is commonly called high blood pressure. This is when the force of your blood pressing against the walls of your arteries is too strong. Arteries are blood vessels that carry blood from your heart throughout your body. Hypertension forces the heart to work harder to pump blood, and may cause the arteries to become narrow or stiff. Having untreated or uncontrolled hypertension can cause heart attack, stroke, kidney disease, and other problems. What are blood pressure readings? A blood pressure reading consists of a higher number over a lower number. Ideally, your blood pressure should be below 120/80. The first ("top") number is called the systolic pressure. It is a measure of the pressure in your arteries as your heart beats. The second ("bottom") number is called the diastolic pressure. It is a measure of the pressure in your arteries as the heart relaxes. What does my blood pressure reading mean? Blood pressure is classified into four stages. Based on your blood pressure reading, your health care provider may use the following stages to determine what type of treatment you need, if any. Systolic pressure and diastolic pressure are measured  in a unit called mm Hg. Normal  Systolic pressure: below 120.  Diastolic pressure: below 80. Elevated  Systolic pressure: 120-129.  Diastolic pressure: below  80. Hypertension stage 1  Systolic pressure: 130-139.  Diastolic pressure: 80-89. Hypertension stage 2  Systolic pressure: 140 or above.  Diastolic pressure: 90 or above. What health risks are associated with hypertension? Managing your hypertension is an important responsibility. Uncontrolled hypertension can lead to:  A heart attack.  A stroke.  A weakened blood vessel (aneurysm).  Heart failure.  Kidney damage.  Eye damage.  Metabolic syndrome.  Memory and concentration problems. What changes can I make to manage my hypertension? Hypertension can be managed by making lifestyle changes and possibly by taking medicines. Your health care provider will help you make a plan to bring your blood pressure within a normal range. Eating and drinking   Eat a diet that is high in fiber and potassium, and low in salt (sodium), added sugar, and fat. An example eating plan is called the DASH (Dietary Approaches to Stop Hypertension) diet. To eat this way: ? Eat plenty of fresh fruits and vegetables. Try to fill half of your plate at each meal with fruits and vegetables. ? Eat whole grains, such as whole wheat pasta, brown rice, or whole grain bread. Fill about one quarter of your plate with whole grains. ? Eat low-fat diary products. ? Avoid fatty cuts of meat, processed or cured meats, and poultry with skin. Fill about one quarter of your plate with lean proteins such as fish, chicken without skin, beans, eggs, and tofu. ? Avoid premade and processed foods. These tend to be higher in sodium, added sugar, and fat.  Reduce your daily sodium intake. Most people with hypertension should eat less than 1,500 mg of sodium a day.  Limit alcohol intake to no more than 1 drink a day for nonpregnant women and 2 drinks a day for men. One drink equals 12 oz of beer, 5 oz of wine, or 1 oz of hard liquor. Lifestyle  Work with your health care provider to maintain a healthy body weight, or to  lose weight. Ask what an ideal weight is for you.  Get at least 30 minutes of exercise that causes your heart to beat faster (aerobic exercise) most days of the week. Activities may include walking, swimming, or biking.  Include exercise to strengthen your muscles (resistance exercise), such as weight lifting, as part of your weekly exercise routine. Try to do these types of exercises for 30 minutes at least 3 days a week.  Do not use any products that contain nicotine or tobacco, such as cigarettes and e-cigarettes. If you need help quitting, ask your health care provider.  Control any long-term (chronic) conditions you have, such as high cholesterol or diabetes. Monitoring  Monitor your blood pressure at home as told by your health care provider. Your personal target blood pressure may vary depending on your medical conditions, your age, and other factors.  Have your blood pressure checked regularly, as often as told by your health care provider. Working with your health care provider  Review all the medicines you take with your health care provider because there may be side effects or interactions.  Talk with your health care provider about your diet, exercise habits, and other lifestyle factors that may be contributing to hypertension.  Visit your health care provider regularly. Your health care provider can help you create and adjust your plan for managing hypertension. Will I  need medicine to control my blood pressure? Your health care provider may prescribe medicine if lifestyle changes are not enough to get your blood pressure under control, and if:  Your systolic blood pressure is 130 or higher.  Your diastolic blood pressure is 80 or higher. Take medicines only as told by your health care provider. Follow the directions carefully. Blood pressure medicines must be taken as prescribed. The medicine does not work as well when you skip doses. Skipping doses also puts you at risk for  problems. Contact a health care provider if:  You think you are having a reaction to medicines you have taken.  You have repeated (recurrent) headaches.  You feel dizzy.  You have swelling in your ankles.  You have trouble with your vision. Get help right away if:  You develop a severe headache or confusion.  You have unusual weakness or numbness, or you feel faint.  You have severe pain in your chest or abdomen.  You vomit repeatedly.  You have trouble breathing. Summary  Hypertension is when the force of blood pumping through your arteries is too strong. If this condition is not controlled, it may put you at risk for serious complications.  Your personal target blood pressure may vary depending on your medical conditions, your age, and other factors. For most people, a normal blood pressure is less than 120/80.  Hypertension is managed by lifestyle changes, medicines, or both. Lifestyle changes include weight loss, eating a healthy, low-sodium diet, exercising more, and limiting alcohol. This information is not intended to replace advice given to you by your health care provider. Make sure you discuss any questions you have with your health care provider. Document Revised: 02/19/2019 Document Reviewed: 09/25/2016 Elsevier Patient Education  2020 [employer] Preventing Health Risks of Being Overweight Maintaining a healthy body weight is an important part of your overall health. Your healthy body weight depends on your age, gender, and height. Being overweight puts you at risk for many health problems, including:  Heart disease.  Diabetes.  Problems sleeping.  Joint problems. You can make changes to your diet and lifestyle to prevent these risks. Consider working with a health care provider or a dietitian to make these changes. What nutrition changes can be made?   Eat only as much as your body needs. In most cases, this is about 2,000 calories a day, but the amount  varies depending on your height, gender, and activity level. Ask your health care provider how many calories you should have each day. Eating more than your body needs on a regular basis can cause you to become overweight or obese.  Eat slowly, and stop eating when you feel full.  Choose healthy foods, including: ? Fruits and vegetables. ? Lean meats. ? Low-fat dairy products. ? High-fiber foods, such as whole grains and beans. ? Healthy snacks like vegetable sticks, a piece of fruit, or a small amount of yogurt or cheese.  Avoid foods and drinks that are high in sugar, salt (sodium), saturated fat, or trans fat. This includes: ? Many desserts such as candy, cookies, and ice cream. ? Soda. ? Fried foods. ? Processed meats such as hot dogs or lunch meats. ? Prepackaged snack foods. What lifestyle changes can be made?   Exercise for at least 150 minutes a week to prevent weight gain, or as often as recommended by your health care provider. Do moderate-intensity exercise, such as brisk walking. ? Spread it out by exercising for 30 minutes 5  days a week, or in short 10-minute bursts several times a day.  Find other ways to stay active and burn calories, such as yard work or a hobby that involves physical activity.  Get at least 8 hours of sleep each night. When you are well-rested, you are more likely to be active and make healthy choices during the day. To sleep better: ? Try to go to bed and wake up at about the same time every day. ? Keep your bedroom dark, quiet, and cool. ? Make sure that your bed is comfortable. ? Avoid stimulating activities, such as watching television or exercising, for at least one hour before bedtime. Why are these changes important? Eating healthy and being active helps you lose weight and prevent health problems caused by being overweight. Making these changes can also help you manage stress, feel better mentally, and connect with friends and family. What can  happen if changes are not made? Being overweight can affect you for your entire life. You may develop joint or bone problems that make it painful or difficult for you to play sports or do activities you enjoy. Being overweight puts stress on your heart and lungs and can lead to medical problems like diabetes, heart disease, and sleeping problems. Where to find support You can get support for preventing health risks of being overweight from:  Your health care provider or a dietitian. They can provide guidance about healthy eating and healthy lifestyle choices.  Weight loss support groups, online or in-person. Where to find more information  MyPlate: https://ball-collins.biz/ ? This an online tool that provides personalized recommendations about foods to eat each day.  The Centers for Disease Control and Prevention: AffordableScrapbook.gl ? This resource gives tips for managing weight and having an active lifestyle. Summary  To prevent unhealthy weight gain, it is important to maintain a healthy diet high in vegetables and whole grains, exercise regularly, and get at least 8 hours of sleep each night.  Making these changes helps prevent many long-term (chronic) health conditions that can shorten your life, such as diabetes, heart disease, and stroke. This information is not intended to replace advice given to you by your health care provider. Make sure you discuss any questions you have with your health care provider. Document Revised: 07/21/2019 Document Reviewed: 09/24/2017 Elsevier Patient Education  2020 [employer] Inc. Athlete's Foot  Athlete's foot (tinea pedis) is a fungal infection of the skin on your feet. It often occurs on the skin that is between or underneath the toes. It can also occur on the soles of your feet. The infection can spread from person to person (is contagious). It can also spread when a person's bare feet come in contact with the fungus on shower floors or on items  such as shoes. What are the causes? This condition is caused by a fungus that grows in warm, moist places. You can get athlete's foot by sharing shoes, shower stalls, towels, and wet floors with someone who is infected. Not washing your feet or changing your socks often enough can also lead to athlete's foot. What increases the risk? This condition is more likely to develop in:  Men.  People who have a weak body defense system (immune system).  People who have diabetes.  People who use public showers, such as at a gym.  People who wear heavy-duty shoes, such as [employer].  Seasons with warm, humid weather. What are the signs or symptoms? Symptoms of this condition include:  Itchy areas between your toes or on the soles of your feet.  White, flaky, or scaly areas between your toes or on the soles of your feet.  Very itchy small blisters between your toes or on the soles of your feet.  Small cuts in your skin. These cuts can become infected.  Thick or discolored toenails. How is this diagnosed? This condition may be diagnosed with a physical exam and a review of your medical history. Your health care provider may also take a skin or toenail sample to examine under a microscope. How is this treated? This condition is treated with antifungal medicines. These may be applied as powders, ointments, or creams. In severe cases, an oral antifungal medicine may be given. Follow these instructions at home: Medicines  Apply or take over-the-counter and prescription medicines only as told by your health care provider.  Apply your antifungal medicine as told by your health care provider. Do not stop using the antifungal even if your condition improves. Foot care  Do not scratch your feet.  Keep your feet dry: ? Wear cotton or wool socks. Change your socks every day or if they become wet. ? Wear shoes that allow air to flow, such as sandals or canvas tennis  shoes.  Wash and dry your feet, including the area between your toes. Also, wash and dry your feet: ? Every day or as told by your health care provider. ? After exercising. General instructions  Do not let others use towels, shoes, nail clippers, or other personal items that touch your feet.  Protect your feet by wearing sandals in wet areas, such as locker rooms and shared showers.  Keep all follow-up visits as told by your health care provider. This is important.  If you have diabetes, keep your blood sugar under control. Contact a health care provider if:  You have a fever.  You have swelling, soreness, warmth, or redness in your foot.  Your feet are not getting better with treatment.  Your symptoms get worse.  You have new symptoms. Summary  Athlete's foot (tinea pedis) is a fungal infection of the skin on your feet. It often occurs on skin that is between or underneath the toes.  This condition is caused by a fungus that grows in warm, moist places.  Symptoms include white, flaky, or scaly areas between your toes or on the soles of your feet.  This condition is treated with antifungal medicines.  Keep your feet clean. Always dry them thoroughly. This information is not intended to replace advice given to you by your health care provider. Make sure you discuss any questions you have with your health care provider. Document Revised: 10/23/2017 Document Reviewed: 08/18/2017 Elsevier Patient Education  2020 [employer] Inc. Diabetes Mellitus and Foot Care Foot care is an important part of your health, especially when you have diabetes. Diabetes may cause you to have problems because of poor blood flow (circulation) to your feet and legs, which can cause your skin to:  Become thinner and drier.  Break more easily.  Heal more slowly.  Peel and crack. You may also have nerve damage (neuropathy) in your legs and feet, causing decreased feeling in them. This means that you  may not notice minor injuries to your feet that could lead to more serious problems. Noticing and addressing any potential problems early is the best way to prevent future foot problems. How to care for your feet Foot hygiene  Wash your feet daily with warm water  and mild soap. Do not use hot water. Then, pat your feet and the areas between your toes until they are completely dry. Do not soak your feet as this can dry your skin.  Trim your toenails straight across. Do not dig under them or around the cuticle. File the edges of your nails with an emery board or nail file.  Apply a moisturizing lotion or petroleum jelly to the skin on your feet and to dry, brittle toenails. Use lotion that does not contain alcohol and is unscented. Do not apply lotion between your toes. Shoes and socks  Wear clean socks or stockings every day. Make sure they are not too tight. Do not wear knee-high stockings since they may decrease blood flow to your legs.  Wear shoes that fit properly and have enough cushioning. Always look in your shoes before you put them on to be sure there are no objects inside.  To break in new shoes, wear them for just a few hours a day. This prevents injuries on your feet. Wounds, scrapes, corns, and calluses  Check your feet daily for blisters, cuts, bruises, sores, and redness. If you cannot see the bottom of your feet, use a mirror or ask someone for help.  Do not cut corns or calluses or try to remove them with medicine.  If you find a minor scrape, cut, or break in the skin on your feet, keep it and the skin around it clean and dry. You may clean these areas with mild soap and water. Do not clean the area with peroxide, alcohol, or iodine.  If you have a wound, scrape, corn, or callus on your foot, look at it several times a day to make sure it is healing and not infected. Check for: ? Redness, swelling, or pain. ? Fluid or blood. ? Warmth. ? Pus or a bad smell. General  instructions  Do not cross your legs. This may decrease blood flow to your feet.  Do not use heating pads or hot water bottles on your feet. They may burn your skin. If you have lost feeling in your feet or legs, you may not know this is happening until it is too late.  Protect your feet from hot and cold by wearing shoes, such as at the beach or on hot pavement.  Schedule a complete foot exam at least once a year (annually) or more often if you have foot problems. If you have foot problems, report any cuts, sores, or bruises to your health care provider immediately. Contact a health care provider if:  You have a medical condition that increases your risk of infection and you have any cuts, sores, or bruises on your feet.  You have an injury that is not healing.  You have redness on your legs or feet.  You feel burning or tingling in your legs or feet.  You have pain or cramps in your legs and feet.  Your legs or feet are numb.  Your feet always feel cold.  You have pain around a toenail. Get help right away if:  You have a wound, scrape, corn, or callus on your foot and: ? You have pain, swelling, or redness that gets worse. ? You have fluid or blood coming from the wound, scrape, corn, or callus. ? Your wound, scrape, corn, or callus feels warm to the touch. ? You have pus or a bad smell coming from the wound, scrape, corn, or callus. ? You have a fever. ?  You have a red line going up your leg. Summary  Check your feet every day for cuts, sores, red spots, swelling, and blisters.  Moisturize feet and legs daily.  Wear shoes that fit properly and have enough cushioning.  If you have foot problems, report any cuts, sores, or bruises to your health care provider immediately.  Schedule a complete foot exam at least once a year (annually) or more often if you have foot problems. This information is not intended to replace advice given to you by your health care provider.  Make sure you discuss any questions you have with your health care provider. Document Revised: 07/21/2019 Document Reviewed: 11/29/2016 Elsevier Patient Education  2020 [employer].  How to Perform a Sinus Rinse A sinus rinse is a home treatment that is used to rinse your sinuses with a sterile mixture of salt and water (saline solution). Sinuses are air-filled spaces in your skull behind the bones of your face and forehead that open into your nasal cavity. A sinus rinse can help to clear mucus, dirt, dust, or pollen from your nasal cavity. You may do a sinus rinse when you have a cold, a virus, nasal allergy symptoms, a sinus infection, or stuffiness in your nose or sinuses. Talk with your health care provider about whether a sinus rinse might help you. What are the risks? A sinus rinse is generally safe and effective. However, there are a few risks, which include: A burning sensation in your sinuses. This may happen if you do not make the saline solution as directed. Be sure to follow all directions when making the saline solution. Nasal irritation. Infection from contaminated water. This is rare, but possible. Do not do a sinus rinse if you have had ear or nasal surgery, ear infection, or blocked ears. Supplies needed: Saline solution or powder. Distilled or sterile water may be needed to mix with saline powder. You may use boiled and cooled tap water. Boil tap water for 5 minutes; cool until it is lukewarm. Use within 24 hours. Do not use regular tap water to mix with the saline solution. Neti pot or nasal rinse bottle. These supplies release the saline solution into your nose and through your sinuses. Neti pots and nasal rinse bottles can be purchased at [employer], a health food store, or online. How to perform a sinus rinse  Wash your hands with soap and water. Wash your device according to the directions that came with the product and then dry it. Use the solution that  comes with your product or one that is sold separately in stores. Follow the mixing directions on the package if you need to mix with sterile or distilled water. Fill the device with the amount of saline solution noted in the device instructions. Stand over a sink and tilt your head sideways over the sink. Place the spout of the device in your upper nostril (the one closer to the ceiling). Gently pour or squeeze the saline solution into your nasal cavity. The liquid should drain out from the lower nostril if you are not too congested. While rinsing, breathe through your open mouth. Gently blow your nose to clear any mucus and rinse solution. Blowing too hard may cause ear pain. Repeat in your other nostril. Clean and rinse your device with clean water and then air-dry it. Talk with your health care provider or pharmacist if you have questions about how to do a sinus rinse. Summary A sinus rinse is a home  treatment that is used to rinse your sinuses with a sterile mixture of salt and water (saline solution). A sinus rinse is generally safe and effective. Follow all instructions carefully. Before doing a sinus rinse, talk with your health care provider about whether it would be helpful for you. This information is not intended to replace advice given to you by your health care provider. Make sure you discuss any questions you have with your health care provider. Document Revised: 08/25/2017 Document Reviewed: 08/25/2017 Elsevier Patient Education  2020 [employer]. Allergic Rhinitis, Adult Allergic rhinitis is an allergic reaction that affects the mucous membrane inside the nose. It causes sneezing, a runny or stuffy nose, and the feeling of mucus going down the back of the throat (postnasal drip). Allergic rhinitis can be mild to severe. There are two types of allergic rhinitis: Seasonal. This type is also called hay fever. It happens only during certain seasons. Perennial. This type can happen  at any time of the year. What are the causes? This condition happens when the body's defense system (immune system) responds to certain harmless substances called allergens as though they were germs.  Seasonal allergic rhinitis is triggered by pollen, which can come from grasses, trees, and weeds. Perennial allergic rhinitis may be caused by: House dust mites. Pet dander. Mold spores. What are the signs or symptoms? Symptoms of this condition include: Sneezing. Runny or stuffy nose (nasal congestion). Postnasal drip. Itchy nose. Tearing of the eyes. Trouble sleeping. Daytime sleepiness. How is this diagnosed? This condition may be diagnosed based on: Your medical history. A physical exam. Tests to check for related conditions, such as: Asthma. Pink eye. Ear infection. Upper respiratory infection. Tests to find out which allergens trigger your symptoms. These may include skin or blood tests. How is this treated? There is no cure for this condition, but treatment can help control symptoms. Treatment may include: Taking medicines that block allergy symptoms, such as antihistamines. Medicine may be given as a shot, nasal spray, or pill. Avoiding the allergen. Desensitization. This treatment involves getting ongoing shots until your body becomes less sensitive to the allergen. This treatment may be done if other treatments do not help. If taking medicine and avoiding the allergen does not work, new, stronger medicines may be prescribed. Follow these instructions at home: Find out what you are allergic to. Common allergens include smoke, dust, and pollen. Avoid the things you are allergic to. These are some things you can do to help avoid allergens: Replace carpet with wood, tile, or vinyl flooring. Carpet can trap dander and dust. Do not smoke. Do not allow smoking in your home. Change your heating and air conditioning filter at least once a month. During allergy season: Keep  windows closed as much as possible. Plan outdoor activities when pollen counts are lowest. This is usually during the evening hours. When coming indoors, change clothing and shower before sitting on furniture or bedding. Take over-the-counter and prescription medicines only as told by your health care provider. Keep all follow-up visits as told by your health care provider. This is important. Contact a health care provider if: You have a fever. You develop a persistent cough. You make whistling sounds when you breathe (you wheeze). Your symptoms interfere with your normal daily activities. Get help right away if: You have shortness of breath. Summary This condition can be managed by taking medicines as directed and avoiding allergens. Contact your health care provider if you develop a persistent cough or fever. During  allergy season, keep windows closed as much as possible. This information is not intended to replace advice given to you by your health care provider. Make sure you discuss any questions you have with your health care provider. Document Revised: 10/10/2017 Document Reviewed: 12/05/2016 Elsevier Patient Education  2020 [employer].

## 2019-11-18 LAB — CMP12+LP+TP+TSH+6AC+PSA+CBC…
ALT: 13 IU/L (ref 0–44)
AST: 18 IU/L (ref 0–40)
Albumin/Globulin Ratio: 1.5 (ref 1.2–2.2)
Albumin: 4.3 g/dL (ref 3.8–4.9)
Alkaline Phosphatase: 64 IU/L (ref 39–117)
BUN/Creatinine Ratio: 17 (ref 9–20)
BUN: 19 mg/dL (ref 6–24)
Basophils Absolute: 0.1 10*3/uL (ref 0.0–0.2)
Basos: 1 %
Bilirubin Total: 0.3 mg/dL (ref 0.0–1.2)
Calcium: 9.5 mg/dL (ref 8.7–10.2)
Chloride: 99 mmol/L (ref 96–106)
Chol/HDL Ratio: 4.3 ratio (ref 0.0–5.0)
Cholesterol, Total: 162 mg/dL (ref 100–199)
Creatinine, Ser: 1.11 mg/dL (ref 0.76–1.27)
EOS (ABSOLUTE): 0.4 10*3/uL (ref 0.0–0.4)
Eos: 5 %
Estimated CHD Risk: 0.8 times avg. (ref 0.0–1.0)
Free Thyroxine Index: 1.9 (ref 1.2–4.9)
GFR calc Af Amer: 84 mL/min/{1.73_m2} (ref >59)
GFR calc non Af Amer: 72 mL/min/{1.73_m2} (ref >59)
GGT: 20 IU/L (ref 0–65)
Globulin, Total: 2.9 g/dL (ref 1.5–4.5)
Glucose: 145 mg/dL — ABNORMAL HIGH (ref 65–99)
HDL: 38 mg/dL — ABNORMAL LOW (ref >39)
Hematocrit: 40.2 % (ref 37.5–51.0)
Hemoglobin: 13.4 g/dL (ref 13.0–17.7)
Immature Grans (Abs): 0 10*3/uL (ref 0.0–0.1)
Immature Granulocytes: 0 %
Iron: 64 ug/dL (ref 38–169)
LDH: 147 IU/L (ref 121–224)
LDL Chol Calc (NIH): 86 mg/dL (ref 0–99)
Lymphocytes Absolute: 1.3 10*3/uL (ref 0.7–3.1)
Lymphs: 19 %
MCH: 27.9 pg (ref 26.6–33.0)
MCHC: 33.3 g/dL (ref 31.5–35.7)
MCV: 84 fL (ref 79–97)
Monocytes Absolute: 0.8 10*3/uL (ref 0.1–0.9)
Monocytes: 12 %
Neutrophils Absolute: 4.6 10*3/uL (ref 1.4–7.0)
Neutrophils: 63 %
Phosphorus: 3.5 mg/dL (ref 2.8–4.1)
Platelets: 242 10*3/uL (ref 150–450)
Potassium: 4.5 mmol/L (ref 3.5–5.2)
Prostate Specific Ag, Serum: 0.7 ng/mL (ref 0.0–4.0)
RBC: 4.8 x10E6/uL (ref 4.14–5.80)
RDW: 12.6 % (ref 11.6–15.4)
Sodium: 137 mmol/L (ref 134–144)
T3 Uptake Ratio: 26 % (ref 24–39)
T4, Total: 7.3 ug/dL (ref 4.5–12.0)
TSH: 4.92 u[IU]/mL — ABNORMAL HIGH (ref 0.450–4.500)
Total Protein: 7.2 g/dL (ref 6.0–8.5)
Triglycerides: 227 mg/dL — ABNORMAL HIGH (ref 0–149)
Uric Acid: 6.8 mg/dL (ref 3.8–8.4)
VLDL Cholesterol Cal: 38 mg/dL (ref 5–40)
WBC: 7.2 10*3/uL (ref 3.4–10.8)

## 2019-11-18 LAB — VITAMIN D 25 HYDROXY (VIT D DEFICIENCY, FRACTURES): Vit D, 25-Hydroxy: 45.4 ng/mL (ref 30.0–100.0)

## 2019-11-18 LAB — MICROALBUMIN / CREATININE URINE RATIO
Creatinine, Urine: 177.2 mg/dL
Microalb/Creat Ratio: <2 mg/g creat (ref 0–29)
Microalbumin, Urine: <3 ug/mL

## 2019-11-18 LAB — HEMOGLOBIN A1C
Est. average glucose Bld gHb Est-mCnc: 171 mg/dL
Hgb A1c MFr Bld: 7.6 % — ABNORMAL HIGH (ref 4.8–5.6)

## 2019-11-18 LAB — VITAMIN B12: Vitamin B-12: 461 pg/mL (ref 232–1245)

## 2019-11-18 MED ORDER — LISINOPRIL-HYDROCHLOROTHIAZIDE 20-12.5 MG PO TABS: 1.0000 | ORAL_TABLET | Freq: Every day | ORAL | 3 refills | Status: DC

## 2019-11-18 MED ORDER — GLIPIZIDE ER 5 MG PO TB24: 5.0000 mg | ORAL_TABLET | Freq: Every day | ORAL | 0 refills | Status: DC

## 2019-11-18 MED ORDER — METFORMIN HCL ER 500 MG PO TB24: 1000.0000 mg | ORAL_TABLET | Freq: Every day | ORAL | 2 refills | Status: DC

## 2019-11-18 MED ORDER — LORATADINE 10 MG PO TABS: 10.0000 mg | ORAL_TABLET | Freq: Every day | ORAL | 3 refills | Status: DC

## 2019-11-18 MED ORDER — SIMVASTATIN 20 MG PO TABS: 20.0000 mg | ORAL_TABLET | Freq: Every day | ORAL | 3 refills | Status: DC

## 2019-11-29 ENCOUNTER — Ambulatory Visit: Payer: Self-pay | Admitting: Urology

## 2019-11-30 ENCOUNTER — Ambulatory Visit: Payer: Self-pay | Admitting: Urology

## 2020-02-22 ENCOUNTER — Other Ambulatory Visit: Payer: Self-pay

## 2020-02-22 VITALS — BP 135/78 | Wt 237.0 lb

## 2020-02-22 DIAGNOSIS — E038 Other specified hypothyroidism: Secondary | ICD-10-CM

## 2020-02-22 DIAGNOSIS — E119 Type 2 diabetes mellitus without complications: Secondary | ICD-10-CM

## 2020-02-22 DIAGNOSIS — H5213 Myopia, bilateral: Secondary | ICD-10-CM | POA: Diagnosis not present

## 2020-02-22 DIAGNOSIS — E039 Hypothyroidism, unspecified: Secondary | ICD-10-CM

## 2020-02-22 NOTE — Progress Notes (Signed)
Return in about 3 months (around 02/15/2020), or if symptoms worsen or fail to improve, for diabetic labs.  Above note from 11/17/19 with encounter with Albina Billet, NP-C.  Requesting Rx refill of Sildenafil.  AMD

## 2020-02-23 LAB — BUN+CREAT
BUN/Creatinine Ratio: 16 (ref 9–20)
BUN: 18 mg/dL (ref 6–24)
Creatinine, Ser: 1.15 mg/dL (ref 0.76–1.27)
GFR calc Af Amer: 80 mL/min/{1.73_m2} (ref >59)
GFR calc non Af Amer: 69 mL/min/{1.73_m2} (ref >59)

## 2020-02-23 LAB — HGB A1C W/O EAG: Hgb A1c MFr Bld: 8 % — ABNORMAL HIGH (ref 4.8–5.6)

## 2020-02-23 NOTE — Progress Notes (Signed)
[employer] [facility] Service at [phone] to add TSH [phone]. No urine specimen was collected yesterday.  Do you want Jess to contact him to come back to clinic to give a specimen?  AMD

## 2020-02-23 NOTE — Addendum Note (Signed)
Addended by: Gardner Candle on: 02/23/2020 04:44 PM   Modules accepted: Orders

## 2020-02-23 NOTE — Progress Notes (Signed)
[employer] Customer Service [phone]) to add TSH #[id number] to blood collected yesterday.  Requested per Albina Billet, NP-C.  AMD

## 2020-02-25 LAB — TSH: TSH: 4.68 u[IU]/mL — ABNORMAL HIGH (ref 0.450–4.500)

## 2020-02-25 LAB — SPECIMEN STATUS REPORT

## 2020-03-03 ENCOUNTER — Other Ambulatory Visit: Payer: Self-pay

## 2020-03-03 DIAGNOSIS — N529 Male erectile dysfunction, unspecified: Secondary | ICD-10-CM

## 2020-03-03 MED ORDER — SILDENAFIL CITRATE 20 MG PO TABS: ORAL_TABLET | ORAL | 1 refills | Status: DC

## 2020-03-04 IMAGING — CT CT RENAL STONE PROTOCOL
2 of 4 series · 15 of 46 positions shown, 17 images · non-contrast
Comparison: None.

CLINICAL DATA: Right flank pain and dysuria

EXAM:
CT ABDOMEN AND PELVIS WITHOUT CONTRAST
TECHNIQUE: Multidetector CT imaging of the abdomen and pelvis was performed
following the standard protocol without oral or IV contrast.

[Series 2: stone full standard · axial · 0.71mm/px · z∈[-1050,-580]mm · 12 of 104 slices shown, 14 images]
[im 5/104  soft-tissue]
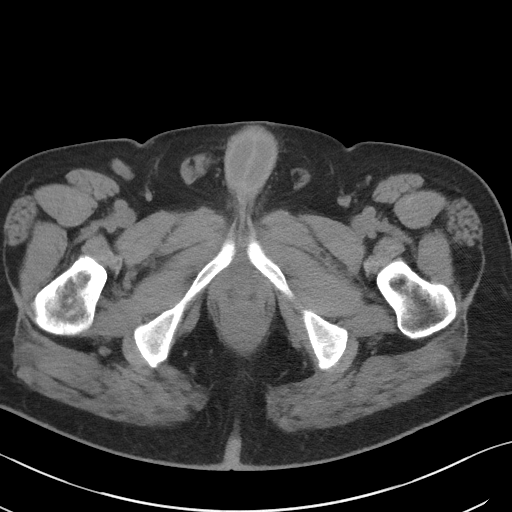
[im 5/104  bone]
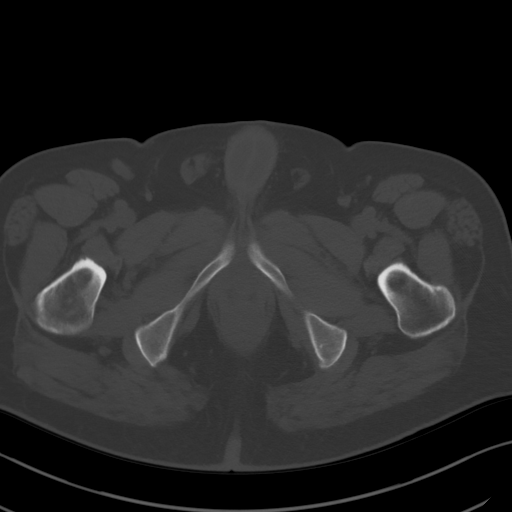
[im 13/104  soft-tissue]
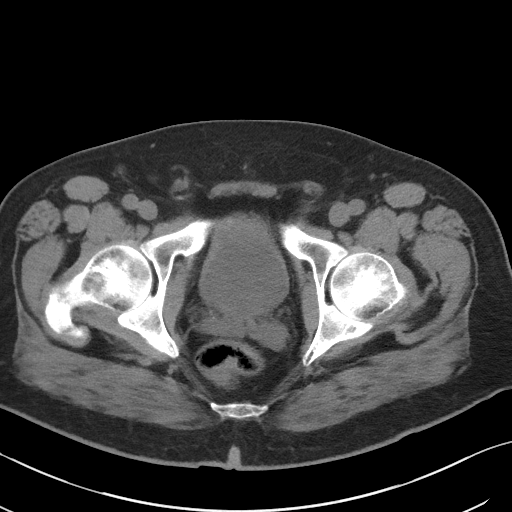
[im 22/104  soft-tissue]
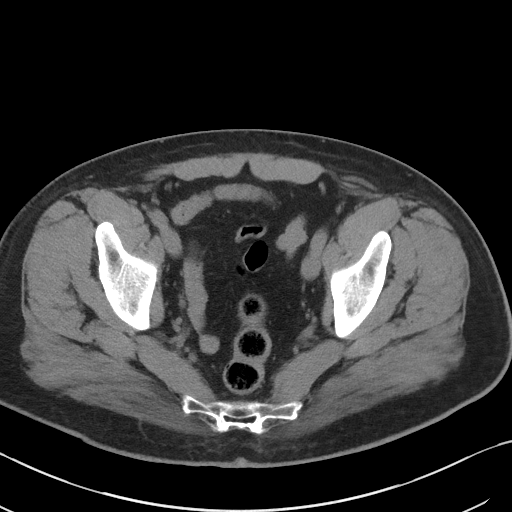
[im 31/104  soft-tissue]
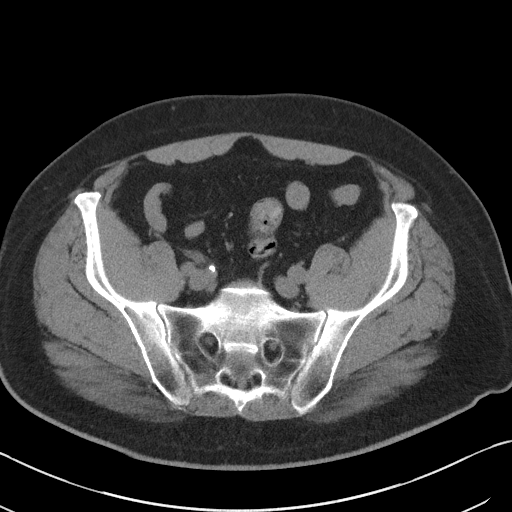
[im 39/104  soft-tissue]
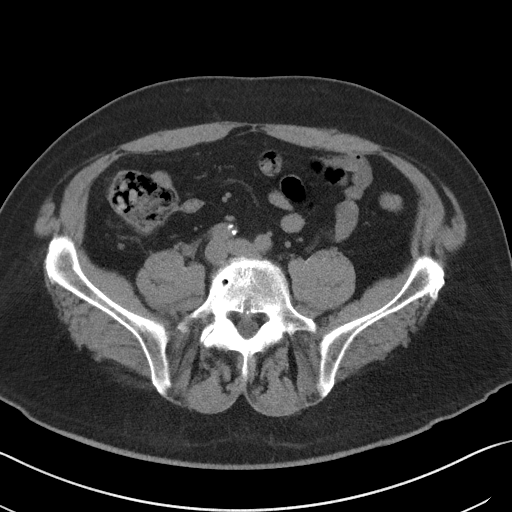
[im 48/104  soft-tissue]
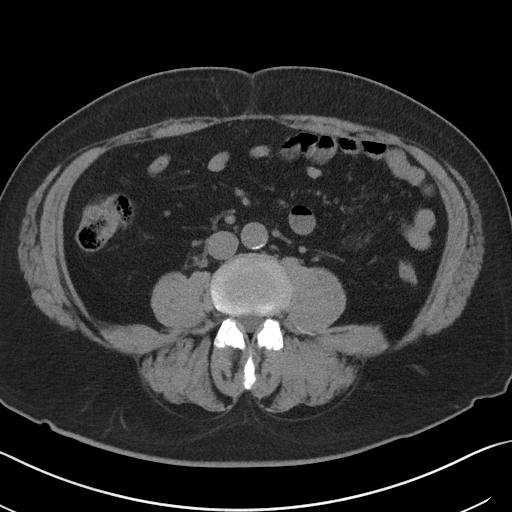
[im 56/104  soft-tissue]
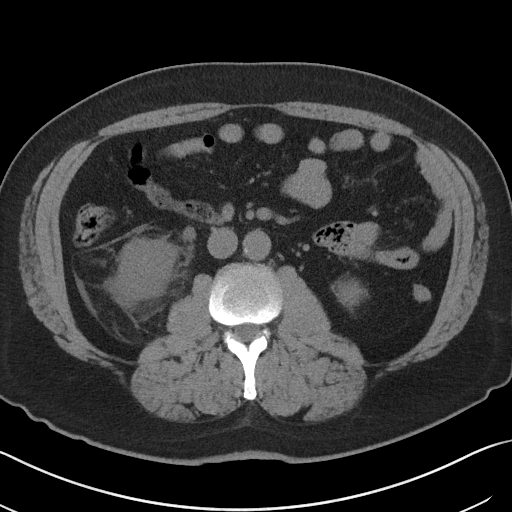
[im 65/104  soft-tissue]
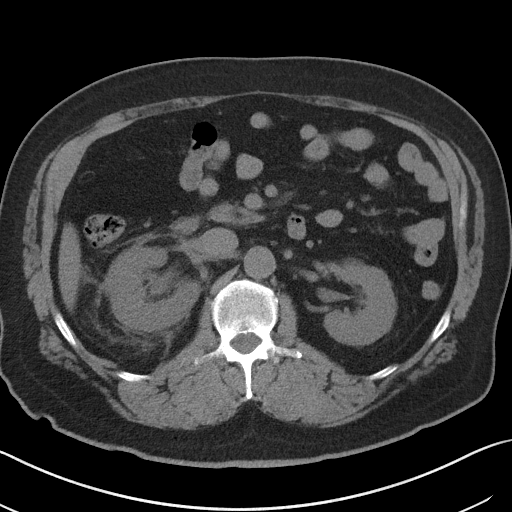
[im 73/104  soft-tissue]
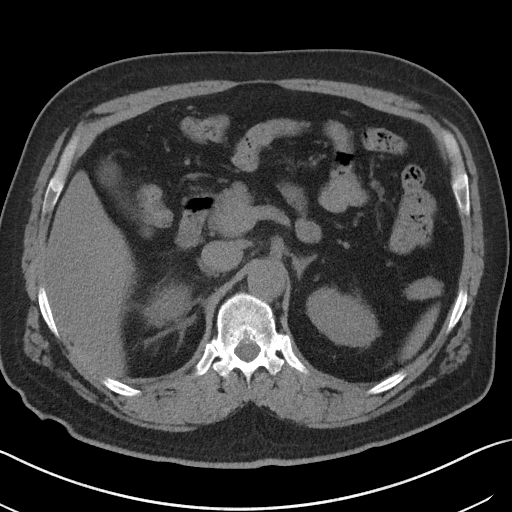
[im 73/104  bone]
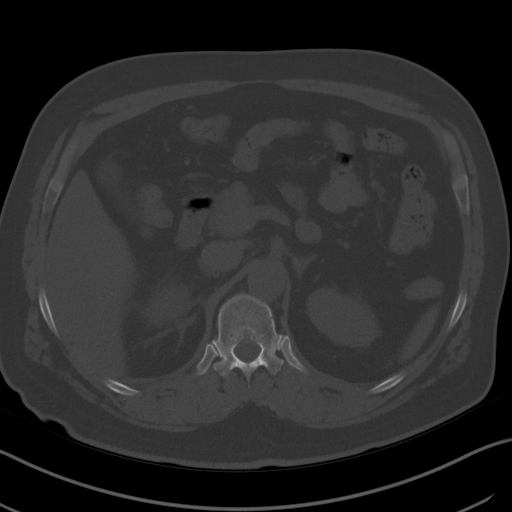
[im 82/104  soft-tissue]
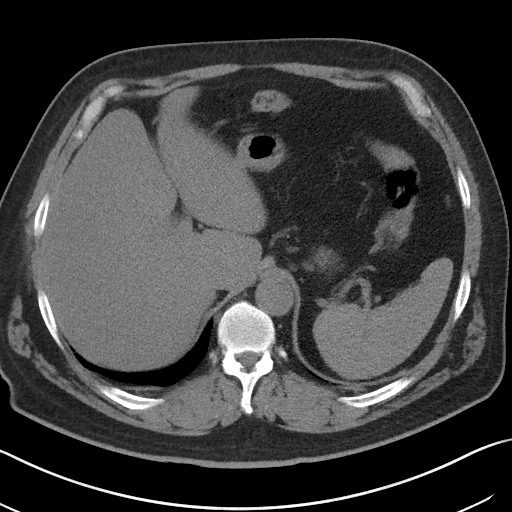
[im 91/104  soft-tissue]
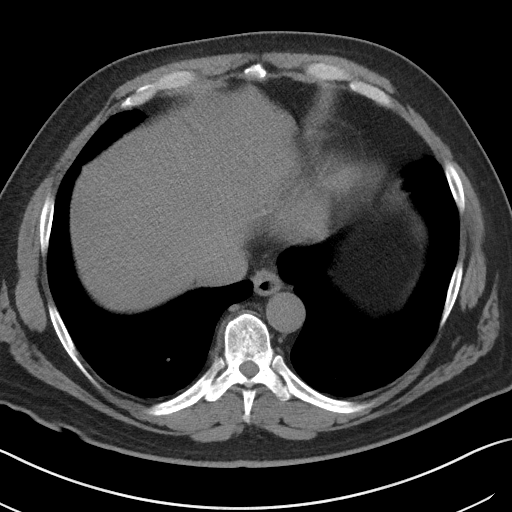
[im 99/104  soft-tissue]
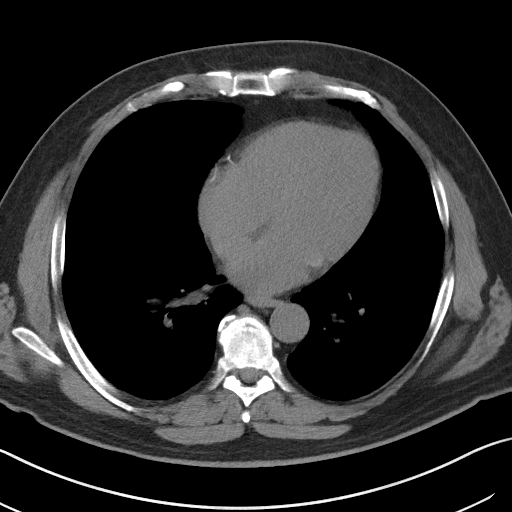

[Series 5: coronal · coronal · 0.79mm/px · 3 of 149 slices shown]
[im 50/149  soft-tissue]
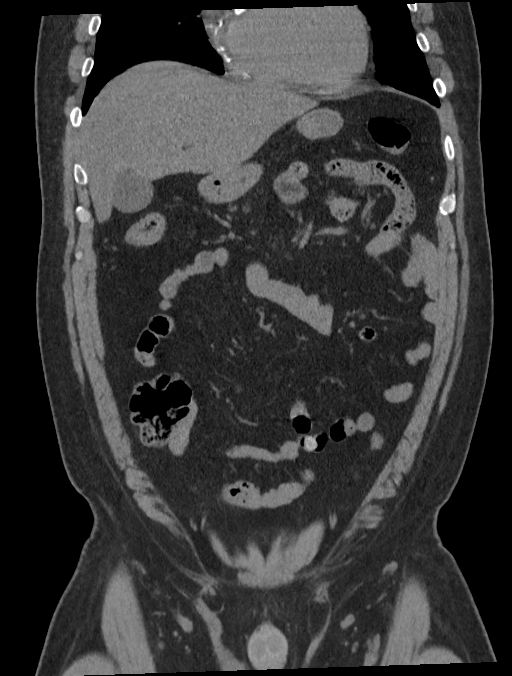
[im 66/149  soft-tissue]
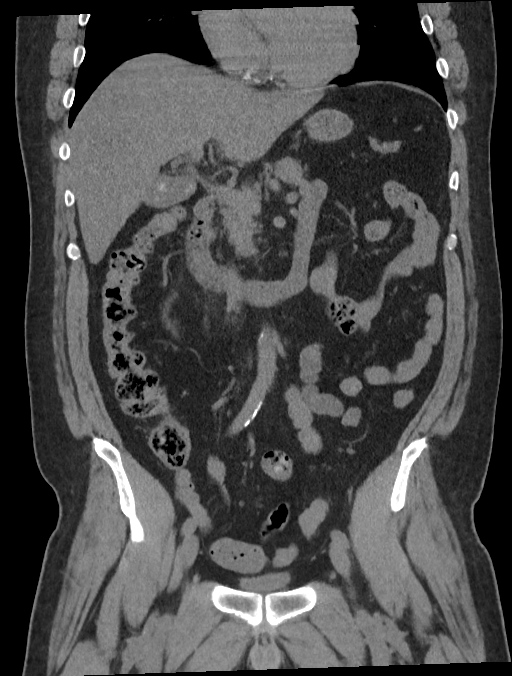
[im 83/149  soft-tissue]
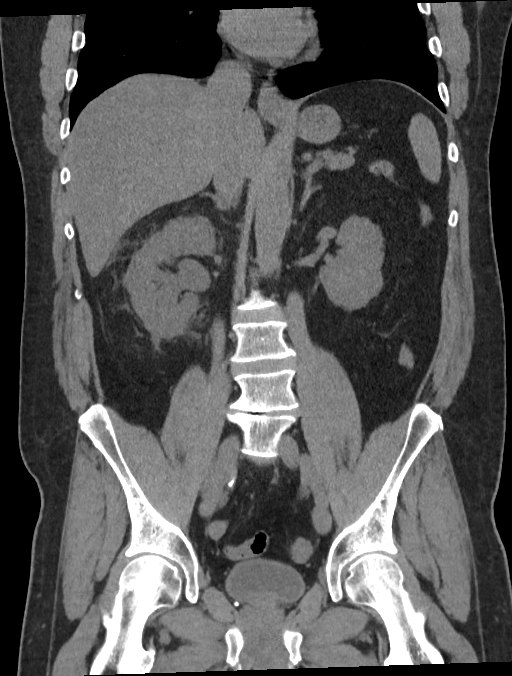

[15 of 46 positions shown; findings below may reference images not displayed]

FINDINGS: Lower chest: Lung bases are clear. There are foci of coronary artery
calcification.

Hepatobiliary: No focal liver lesions are appreciable on this
noncontrast enhanced study. There is cholelithiasis. Gallbladder
wall is not appreciably thickened by CT. There is no evident biliary
duct dilatation.

Pancreas: No pancreatic mass or inflammatory focus evident.

Spleen: No splenic lesions are apparent.

Adrenals/Urinary Tract: Adrenals bilaterally appear normal. Right
kidney is edematous with extensive perinephric stranding on the
right. There is no evident renal mass on either side. There is no
hydronephrosis on the left. There is moderately severe
hydronephrosis on the right. There is no intrarenal calculus on
either side. There is a calculus near the ureterovesical junction in
the distal right ureter measuring 9 x 7 mm. No other ureteral
calculi are evident on either side. Urinary bladder is midline with
wall thickness within normal limits.

Stomach/Bowel: There is no appreciable bowel wall or mesenteric
thickening. There is no evident bowel obstruction. There is no free
air or portal venous air.

Vascular/Lymphatic: There is aortoiliac atherosclerosis. No evident
aneurysm. Major mesenteric arterial vessels appear patent. There is
no adenopathy in the abdomen or pelvis.

Reproductive: There are occasional prostatic calculi. Prostate and
seminal vesicles appear normal in size and contour. There is no
evident pelvic mass.

Other: There is no periappendiceal region inflammation. There is no
abscess or ascites in the abdomen or pelvis. There is fat in each
inguinal ring.

Musculoskeletal: There is degenerative change in the lower lumbar
spine region. Disc space narrowing is severe at L4-5. There are no
blastic or lytic bone lesions. There are no intramuscular or
abdominal wall lesions evident.
IMPRESSION: 1. 9 x 7 mm calculus distal right ureter near the ureterovesical
junction causing moderately severe hydronephrosis on the right.
There is right renal edema as well as perinephric soft tissue
stranding on the right.

2.  Cholelithiasis.

3. No evident bowel obstruction. No abscess in the abdomen pelvis.
No periappendiceal region inflammation.

4.  Occasional tiny prostatic calculi.

5. Aortoiliac atherosclerosis. There are foci of coronary artery
calcification.

Aortic Atherosclerosis ([id number]).

## 2020-03-07 IMAGING — CR DG ABDOMEN 1V
1 series · 2 of 2 positions shown · non-contrast
Comparison: CT 10/02/2018

CLINICAL DATA: 57-year-old male with a history of distal ureteral
stone

EXAM:
ABDOMEN - 1 VIEW

[Series 1: t abdomen supine · 0.14mm/px · 2 of 2 slices shown]
[im 1/2]
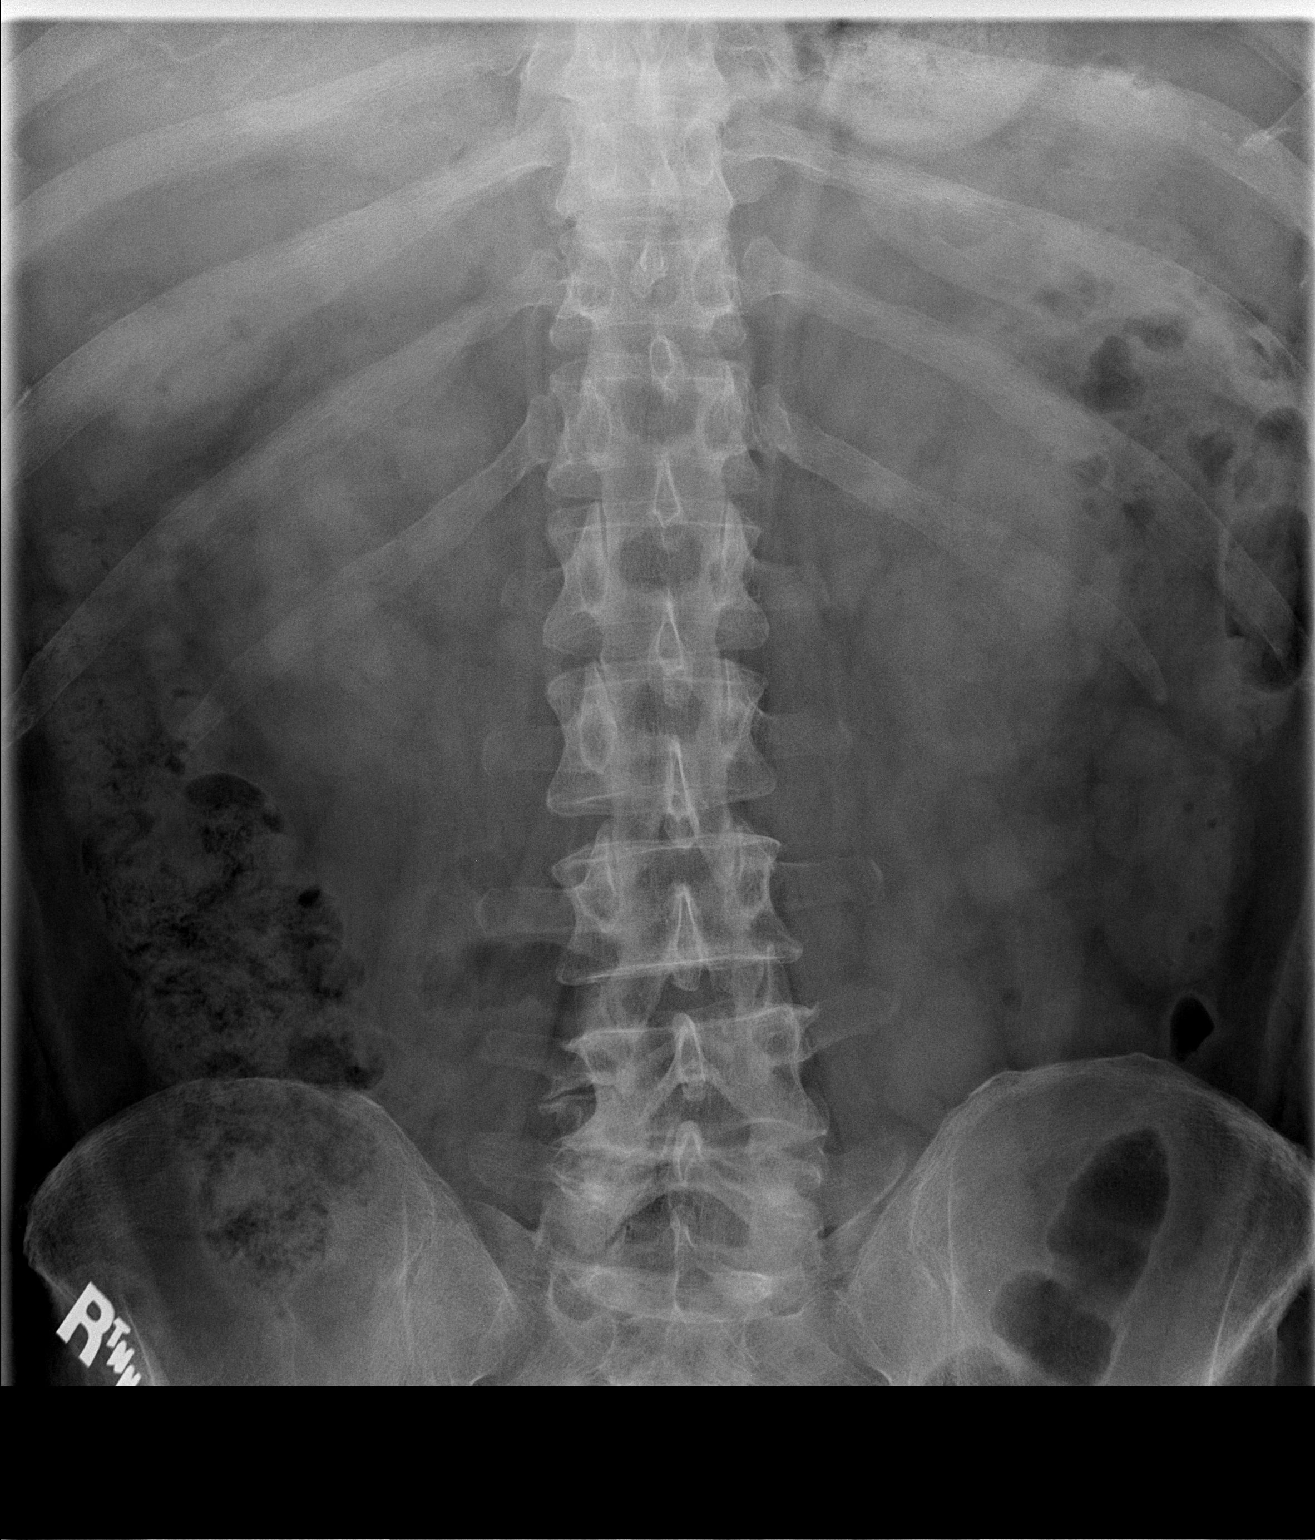
[im 2/2]
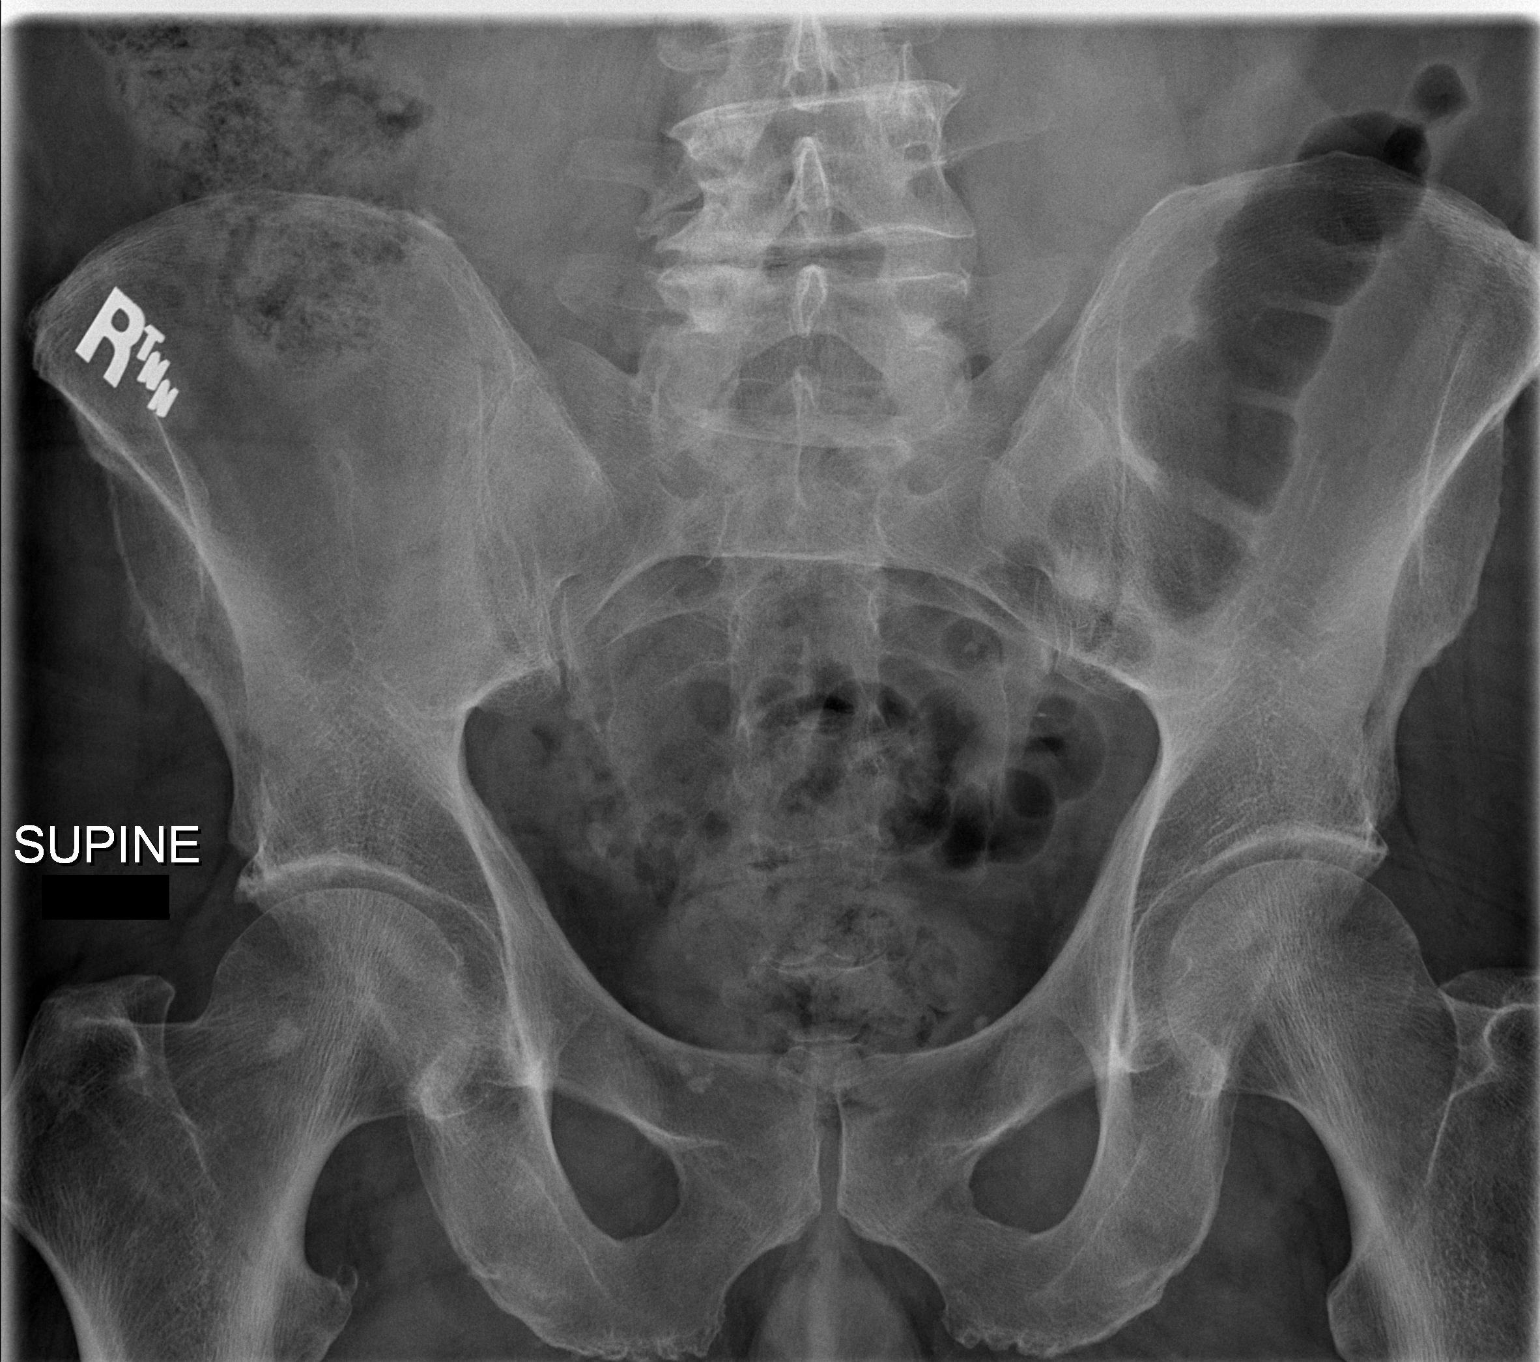

[2 of 2 positions shown; findings below may reference images not displayed]

FINDINGS: Gas within stomach, small bowel, colon. No abnormal distention. Mild
formed stool burden. No unexpected radiopaque foreign body.

There are several rounded calcifications within the anatomic pelvis.
There is geometric calcification within the right pelvis measuring 8
mm along the expected course of the right ureter.

No displaced fracture.
IMPRESSION: 8 mm calcification in the right anatomic pelvis could reflect a
persisting distal right ureteral stone given the history.

Additional presence of pelvic phleboliths.

## 2020-05-08 ENCOUNTER — Other Ambulatory Visit: Payer: Self-pay

## 2020-05-08 DIAGNOSIS — E119 Type 2 diabetes mellitus without complications: Secondary | ICD-10-CM

## 2020-05-08 MED ORDER — METFORMIN HCL ER 500 MG PO TB24: 1000.0000 mg | ORAL_TABLET | Freq: Every day | ORAL | 2 refills | Status: DC

## 2020-08-23 ENCOUNTER — Other Ambulatory Visit: Payer: Self-pay

## 2020-08-23 DIAGNOSIS — N529 Male erectile dysfunction, unspecified: Secondary | ICD-10-CM

## 2020-08-23 MED ORDER — SILDENAFIL CITRATE 20 MG PO TABS: ORAL_TABLET | ORAL | 1 refills | Status: DC

## 2020-11-20 ENCOUNTER — Other Ambulatory Visit: Payer: Self-pay

## 2020-11-20 DIAGNOSIS — E119 Type 2 diabetes mellitus without complications: Secondary | ICD-10-CM

## 2020-11-20 MED ORDER — SITAGLIPTIN PHOSPHATE 50 MG PO TABS: 50.0000 mg | ORAL_TABLET | Freq: Every day | ORAL | 0 refills | Status: DC

## 2020-12-05 ENCOUNTER — Ambulatory Visit: Payer: Self-pay

## 2020-12-05 ENCOUNTER — Other Ambulatory Visit: Payer: Self-pay

## 2020-12-05 DIAGNOSIS — Z0000 Encounter for general adult medical examination without abnormal findings: Secondary | ICD-10-CM

## 2020-12-05 LAB — POCT URINALYSIS DIPSTICK
Bilirubin, UA: NEGATIVE
Blood, UA: NEGATIVE
Glucose, UA: NEGATIVE
Ketones, UA: NEGATIVE
Leukocytes, UA: NEGATIVE
Nitrite, UA: NEGATIVE
Protein, UA: NEGATIVE
Spec Grav, UA: 1.025 (ref 1.010–1.025)
Urobilinogen, UA: 0.2 E.U./dL
pH, UA: 6 (ref 5.0–8.0)

## 2020-12-05 NOTE — Progress Notes (Signed)
Scheduled to complete physical 12/14/20 with Dr. Pricilla Handler.  AMD

## 2020-12-06 ENCOUNTER — Other Ambulatory Visit: Payer: Self-pay | Admitting: Registered Nurse

## 2020-12-06 DIAGNOSIS — E7849 Other hyperlipidemia: Secondary | ICD-10-CM

## 2020-12-06 LAB — CMP12+LP+TP+TSH+6AC+PSA+CBC…
ALT: 14 IU/L (ref 0–44)
AST: 20 IU/L (ref 0–40)
Albumin/Globulin Ratio: 1.3 (ref 1.2–2.2)
Albumin: 4.3 g/dL (ref 3.8–4.9)
Alkaline Phosphatase: 73 IU/L (ref 44–121)
BUN/Creatinine Ratio: 13 (ref 10–24)
BUN: 16 mg/dL (ref 8–27)
Basophils Absolute: 0 10*3/uL (ref 0.0–0.2)
Basos: 1 %
Bilirubin Total: 0.4 mg/dL (ref 0.0–1.2)
Calcium: 9.3 mg/dL (ref 8.6–10.2)
Chloride: 100 mmol/L (ref 96–106)
Chol/HDL Ratio: 4.8 ratio (ref 0.0–5.0)
Cholesterol, Total: 182 mg/dL (ref 100–199)
Creatinine, Ser: 1.21 mg/dL (ref 0.76–1.27)
EOS (ABSOLUTE): 0.2 10*3/uL (ref 0.0–0.4)
Eos: 3 %
Estimated CHD Risk: 1 times avg. (ref 0.0–1.0)
Free Thyroxine Index: 1.8 (ref 1.2–4.9)
GFR calc Af Amer: 75 mL/min/{1.73_m2} (ref >59)
GFR calc non Af Amer: 65 mL/min/{1.73_m2} (ref >59)
GGT: 15 IU/L (ref 0–65)
Globulin, Total: 3.2 g/dL (ref 1.5–4.5)
Glucose: 143 mg/dL — ABNORMAL HIGH (ref 65–99)
HDL: 38 mg/dL — ABNORMAL LOW (ref >39)
Hematocrit: 44.5 % (ref 37.5–51.0)
Hemoglobin: 14.7 g/dL (ref 13.0–17.7)
Immature Grans (Abs): 0.1 10*3/uL (ref 0.0–0.1)
Immature Granulocytes: 1 %
Iron: 73 ug/dL (ref 38–169)
LDH: 252 IU/L — ABNORMAL HIGH (ref 121–224)
LDL Chol Calc (NIH): 108 mg/dL — ABNORMAL HIGH (ref 0–99)
Lymphocytes Absolute: 1.5 10*3/uL (ref 0.7–3.1)
Lymphs: 18 %
MCH: 27.3 pg (ref 26.6–33.0)
MCHC: 33 g/dL (ref 31.5–35.7)
MCV: 83 fL (ref 79–97)
Monocytes Absolute: 0.9 10*3/uL (ref 0.1–0.9)
Monocytes: 11 %
Neutrophils Absolute: 5.3 10*3/uL (ref 1.4–7.0)
Neutrophils: 66 %
Phosphorus: 3.8 mg/dL (ref 2.8–4.1)
Platelets: 242 10*3/uL (ref 150–450)
Potassium: 4.3 mmol/L (ref 3.5–5.2)
Prostate Specific Ag, Serum: 1.1 ng/mL (ref 0.0–4.0)
RBC: 5.39 x10E6/uL (ref 4.14–5.80)
RDW: 14.1 % (ref 11.6–15.4)
Sodium: 138 mmol/L (ref 134–144)
T3 Uptake Ratio: 25 % (ref 24–39)
T4, Total: 7.1 ug/dL (ref 4.5–12.0)
TSH: 4.72 u[IU]/mL — ABNORMAL HIGH (ref 0.450–4.500)
Total Protein: 7.5 g/dL (ref 6.0–8.5)
Triglycerides: 209 mg/dL — ABNORMAL HIGH (ref 0–149)
Uric Acid: 5.6 mg/dL (ref 3.8–8.4)
VLDL Cholesterol Cal: 36 mg/dL (ref 5–40)
WBC: 8 10*3/uL (ref 3.4–10.8)

## 2020-12-06 LAB — MICROALBUMIN / CREATININE URINE RATIO
Creatinine, Urine: 154.8 mg/dL
Microalb/Creat Ratio: 14 mg/g creat (ref 0–29)
Microalbumin, Urine: 21.1 ug/mL

## 2020-12-06 LAB — HGB A1C W/O EAG: Hgb A1c MFr Bld: 8.5 % — ABNORMAL HIGH (ref 4.8–5.6)

## 2020-12-06 LAB — SPECIMEN STATUS REPORT

## 2020-12-07 NOTE — Telephone Encounter (Signed)
Came to [facility] under [place]. COB pt. Thanks.

## 2020-12-14 ENCOUNTER — Ambulatory Visit: Payer: Self-pay | Admitting: Adult Medicine

## 2020-12-14 ENCOUNTER — Other Ambulatory Visit: Payer: Self-pay

## 2020-12-14 ENCOUNTER — Encounter: Payer: Self-pay | Admitting: Adult Medicine

## 2020-12-14 VITALS — BP 130/88 | HR 68 | Temp 98.4°F | Resp 14 | Ht 70.0 in | Wt 230.0 lb

## 2020-12-14 DIAGNOSIS — Z0000 Encounter for general adult medical examination without abnormal findings: Secondary | ICD-10-CM

## 2020-12-14 DIAGNOSIS — I10 Essential (primary) hypertension: Secondary | ICD-10-CM

## 2020-12-14 MED ORDER — VIT C-QUERCET-BIOFLV-BROMELAIN 450-250-125-50 MG PO CAPS
2.0000 | ORAL_CAPSULE | Freq: Three times a day (TID) | ORAL | 0 refills | Status: DC
Start: 2020-12-14 — End: 2021-09-25

## 2020-12-14 MED ORDER — VITAMIN D (ERGOCALCIFEROL) 1.25 MG (50000 UNIT) PO CAPS: 50000.0000 [IU] | ORAL_CAPSULE | ORAL | 1 refills | Status: AC

## 2020-12-14 MED ORDER — METOLAZONE 5 MG PO TABS: 5.0000 mg | ORAL_TABLET | Freq: Every day | ORAL | 2 refills | Status: DC

## 2020-12-14 MED ORDER — CHROMIUM GTF 200 MCG PO TABS: 200.0000 ug | ORAL_TABLET | Freq: Two times a day (BID) | ORAL | 3 refills | Status: AC

## 2020-12-14 MED ORDER — PHOSPHATIDYL CHOLINE 65 MG PO TABS: 65.0000 mg | ORAL_TABLET | Freq: Two times a day (BID) | ORAL | 3 refills | Status: DC

## 2020-12-14 NOTE — Progress Notes (Signed)
HISTORY  Chief Complaint No chief complaint on file.   HPI Aaron Smith is a 60 y.o. male        Past Medical History:  Diagnosis Date  . Diabetes mellitus without complication (HCC)   . Hypertension     Patient Active Problem List   Diagnosis Date Noted  . Diabetes (HCC) 08/03/2019  . Essential hypertension 08/03/2019  . Type 2 diabetes mellitus without complication, without long-term current use of insulin (HCC) 08/03/2019  . Class 1 obesity due to excess calories with serious comorbidity and body mass index (BMI) of 33.0 to 33.9 in adult 08/03/2019  . Other hyperlipidemia 08/03/2019  . Subclinical hypothyroidism 08/03/2019  . Borderline anemia 08/03/2019  . History of colonic polyps 08/03/2019    Past Surgical History:  Procedure Laterality Date  . CYSTOSCOPY/URETEROSCOPY/HOLMIUM LASER/STENT PLACEMENT Right 10/07/2018   Procedure: CYSTOSCOPY/URETEROSCOPY/HOLMIUM LASER/STENT PLACEMENT;  Surgeon: Riki Altes, MD;  Location: [facility] ORS;  Service: Urology;  Laterality: Right;    Prior to Admission medications   Medication Sig Start Date End Date Taking? Authorizing Provider  lisinopril-hydrochlorothiazide (ZESTORETIC) 20-12.5 MG tablet Take 1 tablet by mouth daily. 11/18/19 11/17/20  Betancourt, Jarold Song, NP  loratadine (CLARITIN) 10 MG tablet Take 1 tablet (10 mg total) by mouth daily. 11/18/19 02/16/20  Betancourt, Jarold Song, NP  metFORMIN (GLUCOPHAGE-XR) 500 MG 24 hr tablet Take 2 tablets (1,000 mg total) by mouth daily with breakfast. 05/08/20   Tommi Rumps, PA-C  Multiple Vitamin (MULTIVITAMIN) capsule Take by mouth.    [provider]  naproxen (NAPROSYN) 250 MG tablet Take by mouth 2 (two) times daily with a meal.    [provider]  sildenafil (REVATIO) 20 MG tablet Take 1-3 tabs po prn x1 for sex 08/23/20   Joni Reining, PA-C  simvastatin (ZOCOR) 20 MG tablet TAKE ONE TABLET EVERY DAY 12/10/20   Pricilla Handler, MD  sitaGLIPtin (JANUVIA) 50  MG tablet Take 1 tablet (50 mg total) by mouth daily. 11/20/20   Joni Reining, PA-C    Allergies Penicillins  No family history on file.  Social History Social History   Tobacco Use  . Smoking status: Never Smoker  . Smokeless tobacco: Never Used  Substance Use Topics  . Drug use: Never    Review of Systems Constitutional: No fever/chills Eyes: No visual changes. ENT: No sore throat. Cardiovascular: Denies chest pain. Respiratory: Denies shortness of breath. Gastrointestinal: No abdominal pain.  No nausea, no vomiting.  No diarrhea.  No constipation. Genitourinary: Negative for dysuria. Musculoskeletal: Negative for back pain. Skin: Negative for rash. Neurological: Negative for headaches, focal weakness or numbness. {**Psychiatric:  Endocrine:  Hematological/Lymphatic:  Allergic/Immunilogical:  ____________________________________________   PHYSICAL EXAM:  VITAL SIGNS: Constitutional: Alert and oriented. Well appearing and in no acute distress. Eyes: Conjunctivae are normal. PERRL. EOMI. Head: Atraumatic. Nose: No congestion/rhinnorhea. Mouth/Throat: Mucous membranes are moist.  Oropharynx non-erythematous. Neck: No stridor.  No cervical spine tenderness to palpation Hematological/Lymphatic/Immunilogical: No cervical lymphadenopathy. Cardiovascular: Normal rate, regular rhythm. Grossly normal heart sounds.  Good peripheral circulation. Respiratory: Normal respiratory effort.  No retractions. Lungs CTAB. Gastrointestinal: Soft and nontender. No distention. No abdominal bruits. No CVA tenderness. Genitourinary:  Extremity spider veins, early dilated LE veins Musculoskeletal: No lower extremity tenderness nor edema.  No joint effusions. Neurologic:  Normal speech and language. No gross focal neurologic deficits are appreciated. No gait instability. Skin:  Skin is warm, dry and intact. No rash noted. Psychiatric: Mood and affect  are normal. Speech and behavior are  normal.  ____________________________________________   LABS  Hgb A1c MFr Bld 8.5High %      Glucose 143High mg/dL Chol/HDL Ratio 4.8 ratio   Uric Acid 5.6 mg/dL  Estimated CHD Risk 1.0 times avg.   BUN 16 mg/dL TSH 4.166AYTK uIU/mL  Creatinine, Ser 1.21 mg/dL T4, Total 7.1 ug/dL  GFR calc non Af Amer 65 mL/min/1.73 T3 Uptake Ratio 25 %  GFR calc Af Amer 75 mL/min/1.73  Free Thyroxine Index 1.8  BUN/Creatinine Ratio 13 Prostate Specific Ag, Serum 1.1 ng/mL   Sodium 138 mmol/L WBC 8.0 x10E3/uL  Potassium 4.3 mmol/L RBC 5.39 x10E6/uL  Chloride 100 mmol/L Hemoglobin 14.7 g/dL  Calcium 9.3 mg/dL Hematocrit 16.0 %  Phosphorus 3.8 mg/dL MCV 83 fL  Total Protein 7.5 g/dL MCH 10.9 pg  Albumin 4.3 g/dL MCHC 32.3 g/dL  Globulin, Total 3.2 g/dL RDW 55.7 %  Albumin/Globulin Ratio 1.3 Platelets 242 x10E3/uL  Bilirubin Total 0.4 mg/dL Neutrophils 66 %  Alkaline Phosphatase 73 IU/L Lymphs 18 %  LDH 252High IU/L Monocytes 11 %  AST 20 IU/L Eos 3 %  ALT 14 IU/L Basos 1 %  GGT 15 IU/L Neutrophils Absolute 5.3 x10E3/uL  Iron 73 ug/dL Lymphocytes Absolute 1.5 x10E3/uL  Cholesterol, Total 182 mg/dL Monocytes Absolute 0.9 x10E3/uL  Triglycerides 209High mg/dL EOS (ABSOLUTE) 0.2 D22G2/RK  HDL 38Low mg/dL Basophils Absolute 0.0 x10E3/uL  VLDL Cholesterol Cal 36 mg/dL Immature Granulocytes 1 %  LDL Chol Calc (NIH) 108High mg/dL      EKG nsr mild lt axis nl morphology, nl intervals no acute changes  Never  Done  FOOT EXAM (Yearly)  Exam today no clinical finding   Never  Done  OPHTHALMOLOGY EXAM (Yearly)  Fundi exam undilated eye nl findings     INITIAL IMPRESSION / ASSESSMENT AND PLAN  Hyperglycemia fbs,  rtn 6wk ck  With nl lipids except low Hdl  Add vitd choline  dn Htn change to linsinopril to naturetic directic dn New onset Dilated LE veins  Add Quercitin, zinc vite,  Chromium dn Referral gn to nutritionist to discuss euglycemic food, diet exercise dn

## 2020-12-14 NOTE — Patient Instructions (Signed)
Ann our office nurse will work with you to have you seen with wife' nutrition person. Enjoy  drH

## 2021-02-09 ENCOUNTER — Other Ambulatory Visit: Payer: Self-pay

## 2021-02-09 DIAGNOSIS — E119 Type 2 diabetes mellitus without complications: Secondary | ICD-10-CM

## 2021-02-10 ENCOUNTER — Other Ambulatory Visit: Payer: Self-pay | Admitting: Physician Assistant

## 2021-02-10 MED ORDER — METFORMIN HCL 1000 MG PO TABS: 1000.0000 mg | ORAL_TABLET | Freq: Two times a day (BID) | ORAL | 3 refills | Status: DC

## 2021-02-12 ENCOUNTER — Other Ambulatory Visit: Payer: Self-pay

## 2021-04-10 ENCOUNTER — Other Ambulatory Visit: Payer: Self-pay

## 2021-04-10 DIAGNOSIS — E119 Type 2 diabetes mellitus without complications: Secondary | ICD-10-CM

## 2021-04-10 DIAGNOSIS — I10 Essential (primary) hypertension: Secondary | ICD-10-CM

## 2021-04-11 MED ORDER — SITAGLIPTIN PHOSPHATE 50 MG PO TABS: 50.0000 mg | ORAL_TABLET | Freq: Every day | ORAL | 2 refills | Status: DC

## 2021-04-11 MED ORDER — METOLAZONE 5 MG PO TABS: 5.0000 mg | ORAL_TABLET | Freq: Every day | ORAL | 2 refills | Status: DC

## 2021-05-23 DIAGNOSIS — E119 Type 2 diabetes mellitus without complications: Secondary | ICD-10-CM | POA: Diagnosis not present

## 2021-05-23 DIAGNOSIS — H5213 Myopia, bilateral: Secondary | ICD-10-CM | POA: Diagnosis not present

## 2021-05-23 DIAGNOSIS — H18821 Corneal disorder due to contact lens, right eye: Secondary | ICD-10-CM | POA: Diagnosis not present

## 2021-05-24 DIAGNOSIS — H18821 Corneal disorder due to contact lens, right eye: Secondary | ICD-10-CM | POA: Diagnosis not present

## 2021-05-24 DIAGNOSIS — H5213 Myopia, bilateral: Secondary | ICD-10-CM | POA: Diagnosis not present

## 2021-05-24 DIAGNOSIS — E119 Type 2 diabetes mellitus without complications: Secondary | ICD-10-CM | POA: Diagnosis not present

## 2021-05-25 DIAGNOSIS — E119 Type 2 diabetes mellitus without complications: Secondary | ICD-10-CM | POA: Diagnosis not present

## 2021-05-25 DIAGNOSIS — H5213 Myopia, bilateral: Secondary | ICD-10-CM | POA: Diagnosis not present

## 2021-05-25 DIAGNOSIS — H18821 Corneal disorder due to contact lens, right eye: Secondary | ICD-10-CM | POA: Diagnosis not present

## 2021-05-30 DIAGNOSIS — E119 Type 2 diabetes mellitus without complications: Secondary | ICD-10-CM | POA: Diagnosis not present

## 2021-05-30 DIAGNOSIS — H5213 Myopia, bilateral: Secondary | ICD-10-CM | POA: Diagnosis not present

## 2021-05-30 DIAGNOSIS — H18821 Corneal disorder due to contact lens, right eye: Secondary | ICD-10-CM | POA: Diagnosis not present

## 2021-06-27 DIAGNOSIS — E119 Type 2 diabetes mellitus without complications: Secondary | ICD-10-CM | POA: Diagnosis not present

## 2021-06-27 DIAGNOSIS — H5213 Myopia, bilateral: Secondary | ICD-10-CM | POA: Diagnosis not present

## 2021-07-10 ENCOUNTER — Other Ambulatory Visit: Payer: Self-pay

## 2021-07-10 DIAGNOSIS — N529 Male erectile dysfunction, unspecified: Secondary | ICD-10-CM

## 2021-07-10 MED ORDER — SILDENAFIL CITRATE 20 MG PO TABS: ORAL_TABLET | ORAL | 1 refills | Status: DC

## 2021-08-21 ENCOUNTER — Other Ambulatory Visit: Payer: Self-pay

## 2021-08-21 DIAGNOSIS — Z0000 Encounter for general adult medical examination without abnormal findings: Secondary | ICD-10-CM

## 2021-08-21 LAB — POCT URINALYSIS DIPSTICK
Bilirubin, UA: NEGATIVE
Blood, UA: NEGATIVE
Glucose, UA: NEGATIVE
Ketones, UA: NEGATIVE
Leukocytes, UA: NEGATIVE
Nitrite, UA: NEGATIVE
Protein, UA: POSITIVE — AB
Spec Grav, UA: 1.015 (ref 1.010–1.025)
Urobilinogen, UA: 0.2 E.U./dL
pH, UA: 6 (ref 5.0–8.0)

## 2021-08-21 NOTE — Progress Notes (Signed)
Annual physical scheduled 08/28/21. 

## 2021-08-22 LAB — CMP12+LP+TP+TSH+6AC+PSA+CBC…
ALT: 14 IU/L (ref 0–44)
AST: 18 IU/L (ref 0–40)
Albumin/Globulin Ratio: 1.4 (ref 1.2–2.2)
Albumin: 4.3 g/dL (ref 3.8–4.8)
Alkaline Phosphatase: 65 IU/L (ref 44–121)
BUN/Creatinine Ratio: 15 (ref 10–24)
BUN: 21 mg/dL (ref 8–27)
Basophils Absolute: 0.1 10*3/uL (ref 0.0–0.2)
Basos: 1 %
Bilirubin Total: 0.4 mg/dL (ref 0.0–1.2)
Calcium: 9.6 mg/dL (ref 8.6–10.2)
Chloride: 93 mmol/L — ABNORMAL LOW (ref 96–106)
Chol/HDL Ratio: 4.5 ratio (ref 0.0–5.0)
Cholesterol, Total: 149 mg/dL (ref 100–199)
Creatinine, Ser: 1.39 mg/dL — ABNORMAL HIGH (ref 0.76–1.27)
EOS (ABSOLUTE): 0.3 10*3/uL (ref 0.0–0.4)
Eos: 4 %
Estimated CHD Risk: 0.9 times avg. (ref 0.0–1.0)
Free Thyroxine Index: 2.3 (ref 1.2–4.9)
GGT: 13 IU/L (ref 0–65)
Globulin, Total: 3 g/dL (ref 1.5–4.5)
Glucose: 151 mg/dL — ABNORMAL HIGH (ref 70–99)
HDL: 33 mg/dL — ABNORMAL LOW (ref >39)
Hematocrit: 42.5 % (ref 37.5–51.0)
Hemoglobin: 13.8 g/dL (ref 13.0–17.7)
Immature Grans (Abs): 0 10*3/uL (ref 0.0–0.1)
Immature Granulocytes: 0 %
Iron: 76 ug/dL (ref 38–169)
LDH: 155 IU/L (ref 121–224)
LDL Chol Calc (NIH): 83 mg/dL (ref 0–99)
Lymphocytes Absolute: 1.4 10*3/uL (ref 0.7–3.1)
Lymphs: 18 %
MCH: 27.2 pg (ref 26.6–33.0)
MCHC: 32.5 g/dL (ref 31.5–35.7)
MCV: 84 fL (ref 79–97)
Monocytes Absolute: 0.8 10*3/uL (ref 0.1–0.9)
Monocytes: 11 %
Neutrophils Absolute: 5.3 10*3/uL (ref 1.4–7.0)
Neutrophils: 66 %
Phosphorus: 3.8 mg/dL (ref 2.8–4.1)
Platelets: 265 10*3/uL (ref 150–450)
Potassium: 3.5 mmol/L (ref 3.5–5.2)
Prostate Specific Ag, Serum: 0.9 ng/mL (ref 0.0–4.0)
RBC: 5.07 x10E6/uL (ref 4.14–5.80)
RDW: 14 % (ref 11.6–15.4)
Sodium: 139 mmol/L (ref 134–144)
T3 Uptake Ratio: 25 % (ref 24–39)
T4, Total: 9 ug/dL (ref 4.5–12.0)
TSH: 5.57 u[IU]/mL — ABNORMAL HIGH (ref 0.450–4.500)
Total Protein: 7.3 g/dL (ref 6.0–8.5)
Triglycerides: 196 mg/dL — ABNORMAL HIGH (ref 0–149)
Uric Acid: 6.7 mg/dL (ref 3.8–8.4)
VLDL Cholesterol Cal: 33 mg/dL (ref 5–40)
WBC: 7.9 10*3/uL (ref 3.4–10.8)
eGFR: 58 mL/min/{1.73_m2} — ABNORMAL LOW (ref >59)

## 2021-08-27 LAB — HGB A1C W/O EAG: Hgb A1c MFr Bld: 9.4 % — ABNORMAL HIGH (ref 4.8–5.6)

## 2021-08-27 LAB — SPECIMEN STATUS REPORT

## 2021-08-28 ENCOUNTER — Other Ambulatory Visit: Payer: Self-pay

## 2021-08-28 ENCOUNTER — Encounter: Payer: Self-pay | Admitting: Physician Assistant

## 2021-08-28 ENCOUNTER — Ambulatory Visit: Payer: Self-pay | Admitting: Physician Assistant

## 2021-08-28 VITALS — BP 145/90 | HR 58 | Temp 97.7°F | Resp 14 | Ht 70.5 in | Wt 200.0 lb

## 2021-08-28 DIAGNOSIS — E119 Type 2 diabetes mellitus without complications: Secondary | ICD-10-CM

## 2021-08-28 DIAGNOSIS — Z0000 Encounter for general adult medical examination without abnormal findings: Secondary | ICD-10-CM

## 2021-08-28 NOTE — Progress Notes (Signed)
Pt denies any issues or concerns at this time/CL,RMA ?

## 2021-08-28 NOTE — Addendum Note (Signed)
Addended by: Gardner Candle on: 08/28/2021 10:41 AM   Modules accepted: Orders

## 2021-08-28 NOTE — Progress Notes (Signed)
   Subjective: Annual physical exam    Patient ID: Aaron Smith, male    DOB: 24-Jan-1961, 60 y.o.   MRN: [id number]  HPI Patient presents for annual physical exam.  Patient admits poor control of diabetes.  Patient also states been under a lot of stress in the past 6 months with loss of father-in-law and recent loss of father.  There has also been a 30 pound weight loss since last physical exam.   Review of Systems Diabetes, hyperlipidemia, hypertension, hypothyroidism.    Objective:   Physical Exam No acute distress.  Appears mildly depressed.  Temperature 97.7, pulse 58, respiration 14, BP is 145/90, and patient is 1% O2 sat on room air. HEENT is unremarkable.  Neck is supple without adenopathy or bruits.  Lungs are clear to auscultation.  Heart is bradycardic with no acute findings on EKG. Abdomen with negative HSM, normoactive bowel sounds, soft, nontender to palpation. No obvious deformity to the upper or lower extremities.  Patient has full and equal range of motion upper lower extremities. No cervical lumbar spine deformity.  Patient has full and equal range of motion of cervical lumbar spine.   No acute findings on diabetic foot exam. Cranial nerves II through XII grossly intact.  DTR's 2+ without clonus.  Well exam.  Discussed lab results with patient showing diabetes is uncontrolled.  Patient is amenable to a consult endocrinology for definitive evaluation and treatment plan.  Patient will follow-up after evaluation by endocrinologist.

## 2021-08-30 ENCOUNTER — Other Ambulatory Visit: Payer: Self-pay | Admitting: Physician Assistant

## 2021-09-20 ENCOUNTER — Ambulatory Visit: Payer: 59 | Admitting: Physician Assistant

## 2021-09-25 ENCOUNTER — Encounter: Payer: Self-pay | Admitting: Physician Assistant

## 2021-09-25 ENCOUNTER — Other Ambulatory Visit: Payer: Self-pay

## 2021-09-25 ENCOUNTER — Ambulatory Visit: Payer: Self-pay | Admitting: Physician Assistant

## 2021-09-25 VITALS — BP 132/99 | HR 68 | Temp 97.6°F | Resp 14 | Ht 70.5 in | Wt 200.0 lb

## 2021-09-25 DIAGNOSIS — E0800 Diabetes mellitus due to underlying condition with hyperosmolarity without nonketotic hyperglycemic-hyperosmolar coma (NKHHC): Secondary | ICD-10-CM

## 2021-09-25 MED ORDER — GLIPIZIDE 10 MG PO TABS
10.0000 mg | ORAL_TABLET | Freq: Two times a day (BID) | ORAL | 3 refills | Status: DC
Start: 2021-09-25 — End: 2022-05-06

## 2021-09-25 NOTE — Progress Notes (Signed)
   Subjective: 2 diabetes    Patient ID: Aaron Smith, male    DOB: [birth year], 60 y.o.   MRN: [id number]  HPI Patient here for reevaluation of diabetes.  In the past year patient hemoglobin A1c has increased from 7.6-9.4.  Patient has continued taking metformin at 1000 mg twice daily.  Patient was started on Januvia 50 mg on 04/11/2021 after being advised to stop taking Glucotrol 5 mg twice daily.  Patient stated two near syncope episodes related to increased physical activities which resolved by sitting down and drinking 16 ounces of water.  Patient did not check glucose levels at these 2 incidents.   Review of Systems Diabetes, erectile dysfunction, and hyperlipidemia.    Objective:   Physical Exam  No acute distress.  Temperature 97.6, pulse 68, respiration 14, BP is 132/99, and patient 99% O2 sat on room air.  Patient weighs 200 pounds. HEENT is unremarkable.  Neck is supple without lymphadenopathy, thyromegaly, or bruits.  Lungs are clear to auscultation.  Heart is regular rate and rhythm.      Assessment & Plan:   Diabetes with poor control at this time.  Patient is amenable to discontinue Januvia continue metformin, and adding Glucotrol.  Patient advised to return in 3 months for CMP, B12, and hemoglobin A1c.  Advised to contact his office if notice any problems with changes of medications.  Advised to closely monitor glucose for the next 2 weeks due to change in medications.  Advised to have glucose checked for any near syncope episodes.  Patient has a consult to endocrinology scheduled for March 2023.

## 2021-09-25 NOTE — Progress Notes (Signed)
Pt presents today for medication adjustment per Laray Anger

## 2021-10-15 ENCOUNTER — Other Ambulatory Visit: Payer: Self-pay | Admitting: Adult Health

## 2021-10-15 DIAGNOSIS — E119 Type 2 diabetes mellitus without complications: Secondary | ICD-10-CM

## 2021-12-26 ENCOUNTER — Other Ambulatory Visit: Payer: Self-pay

## 2021-12-26 DIAGNOSIS — E538 Deficiency of other specified B group vitamins: Secondary | ICD-10-CM

## 2021-12-26 DIAGNOSIS — E119 Type 2 diabetes mellitus without complications: Secondary | ICD-10-CM

## 2021-12-27 LAB — COMPREHENSIVE METABOLIC PANEL
ALT: 15 IU/L (ref 0–44)
AST: 17 IU/L (ref 0–40)
Albumin/Globulin Ratio: 1.6 (ref 1.2–2.2)
Albumin: 4.5 g/dL (ref 3.8–4.8)
Alkaline Phosphatase: 62 IU/L (ref 44–121)
BUN/Creatinine Ratio: 15 (ref 10–24)
BUN: 26 mg/dL (ref 8–27)
Bilirubin Total: 0.2 mg/dL (ref 0.0–1.2)
CO2: 26 mmol/L (ref 20–29)
Calcium: 9.4 mg/dL (ref 8.6–10.2)
Chloride: 105 mmol/L (ref 96–106)
Creatinine, Ser: 1.7 mg/dL — ABNORMAL HIGH (ref 0.76–1.27)
Globulin, Total: 2.8 g/dL (ref 1.5–4.5)
Glucose: 88 mg/dL (ref 70–99)
Potassium: 3.3 mmol/L — ABNORMAL LOW (ref 3.5–5.2)
Sodium: 135 mmol/L (ref 134–144)
Total Protein: 7.3 g/dL (ref 6.0–8.5)
eGFR: 45 mL/min/{1.73_m2} — ABNORMAL LOW (ref >59)

## 2021-12-27 LAB — HGB A1C W/O EAG: Hgb A1c MFr Bld: 7.3 % — ABNORMAL HIGH (ref 4.8–5.6)

## 2021-12-27 LAB — VITAMIN B12: Vitamin B-12: 320 pg/mL (ref 232–1245)

## 2022-01-02 ENCOUNTER — Other Ambulatory Visit: Payer: Self-pay | Admitting: Physician Assistant

## 2022-01-02 ENCOUNTER — Encounter: Payer: Self-pay | Admitting: Physician Assistant

## 2022-01-02 ENCOUNTER — Ambulatory Visit: Payer: Self-pay | Admitting: Physician Assistant

## 2022-01-02 ENCOUNTER — Other Ambulatory Visit: Payer: Self-pay

## 2022-01-02 DIAGNOSIS — N529 Male erectile dysfunction, unspecified: Secondary | ICD-10-CM

## 2022-01-02 MED ORDER — POTASSIUM CHLORIDE CRYS ER 20 MEQ PO TBCR: 20.0000 meq | EXTENDED_RELEASE_TABLET | Freq: Every day | ORAL | 0 refills | Status: DC

## 2022-01-02 MED ORDER — SILDENAFIL CITRATE 20 MG PO TABS: ORAL_TABLET | ORAL | 1 refills | Status: DC

## 2022-01-02 NOTE — Progress Notes (Signed)
° °  Subjective: Diabetes    Patient ID: Aaron Smith, male    DOB: 09-Dec-1960, 61 y.o.   MRN: [id number]  HPI Patient here for 54-month follow-up for diabetes.  Patient relates increased life stressors secondary to mother diagnosed with dementia with deterioration.  Patient states taking medication as directed.  He is worried about his hemoglobin A1c since he has not seen his latest labs.   Review of Systems Diabetes, erectile dysfunction, hyperlipidemia, and hyperlipidemia hypertension.    Objective:   Physical Exam No acute distress.  Temperature is 98, pulse 70, respiration 12, BP is 125/93, and patient 98% O2 sat on room air.  Patient weighs 200 pounds and BMI is 27.89.  HEENT is unremarkable.  Neck is supple for adenopathy or bruits.  Lungs are clear to auscultation.  Heart regular rate and rhythm.  Abdomen is negative HSM, normoactive bowel sounds, soft, nontender to palpation.     Assessment & Plan: Diabetes  Discussed patient lab results today showing hemoglobin A1c has decreased from 9.4 to 7.3.  Patient encouraged to diet and exercise and continue previous medication.  Noticed patient potassium level was 3.3 start him on potassium for 1 month for reevaluation of hemoglobin A1c and potassium in 3 months.

## 2022-01-02 NOTE — Progress Notes (Signed)
Pt presents today for results. 

## 2022-01-11 ENCOUNTER — Other Ambulatory Visit: Payer: Self-pay | Admitting: Physician Assistant

## 2022-01-11 DIAGNOSIS — E119 Type 2 diabetes mellitus without complications: Secondary | ICD-10-CM

## 2022-02-11 ENCOUNTER — Other Ambulatory Visit: Payer: Self-pay

## 2022-02-11 DIAGNOSIS — E7849 Other hyperlipidemia: Secondary | ICD-10-CM

## 2022-02-11 DIAGNOSIS — I10 Essential (primary) hypertension: Secondary | ICD-10-CM

## 2022-02-11 MED ORDER — SIMVASTATIN 20 MG PO TABS: 20.0000 mg | ORAL_TABLET | Freq: Every day | ORAL | 3 refills | Status: DC

## 2022-02-11 MED ORDER — METOLAZONE 5 MG PO TABS: 5.0000 mg | ORAL_TABLET | Freq: Every day | ORAL | 3 refills | Status: DC

## 2022-03-01 ENCOUNTER — Other Ambulatory Visit: Payer: Self-pay

## 2022-03-01 DIAGNOSIS — E7849 Other hyperlipidemia: Secondary | ICD-10-CM

## 2022-03-01 DIAGNOSIS — I10 Essential (primary) hypertension: Secondary | ICD-10-CM

## 2022-03-01 MED ORDER — SIMVASTATIN 20 MG PO TABS: 20.0000 mg | ORAL_TABLET | Freq: Every day | ORAL | 3 refills | Status: DC

## 2022-03-01 MED ORDER — METOLAZONE 5 MG PO TABS: 5.0000 mg | ORAL_TABLET | Freq: Every day | ORAL | 3 refills | Status: DC

## 2022-04-30 ENCOUNTER — Other Ambulatory Visit: Payer: Self-pay | Admitting: Physician Assistant

## 2022-05-06 ENCOUNTER — Other Ambulatory Visit: Payer: Self-pay

## 2022-05-06 DIAGNOSIS — E119 Type 2 diabetes mellitus without complications: Secondary | ICD-10-CM

## 2022-05-06 MED ORDER — GLIPIZIDE 10 MG PO TABS: 10.0000 mg | ORAL_TABLET | Freq: Two times a day (BID) | ORAL | 3 refills | Status: DC

## 2022-05-20 ENCOUNTER — Other Ambulatory Visit: Payer: Self-pay

## 2022-05-20 DIAGNOSIS — E119 Type 2 diabetes mellitus without complications: Secondary | ICD-10-CM

## 2022-05-20 MED ORDER — METFORMIN HCL ER 500 MG PO TB24: 1000.0000 mg | ORAL_TABLET | Freq: Every day | ORAL | 2 refills | Status: DC

## 2022-09-23 DIAGNOSIS — H5213 Myopia, bilateral: Secondary | ICD-10-CM | POA: Diagnosis not present

## 2022-10-10 ENCOUNTER — Other Ambulatory Visit: Payer: Self-pay | Admitting: Physician Assistant

## 2022-10-10 DIAGNOSIS — N529 Male erectile dysfunction, unspecified: Secondary | ICD-10-CM

## 2022-10-21 ENCOUNTER — Other Ambulatory Visit: Payer: Self-pay

## 2022-10-21 DIAGNOSIS — N529 Male erectile dysfunction, unspecified: Secondary | ICD-10-CM

## 2022-10-21 MED ORDER — SILDENAFIL CITRATE 20 MG PO TABS: ORAL_TABLET | ORAL | 1 refills | Status: DC

## 2023-01-21 ENCOUNTER — Other Ambulatory Visit: Payer: Self-pay

## 2023-01-21 DIAGNOSIS — I10 Essential (primary) hypertension: Secondary | ICD-10-CM

## 2023-01-21 MED ORDER — METOLAZONE 5 MG PO TABS: 5.0000 mg | ORAL_TABLET | Freq: Every day | ORAL | 3 refills | Status: DC

## 2023-02-13 ENCOUNTER — Other Ambulatory Visit: Payer: Self-pay | Admitting: Physician Assistant

## 2023-02-13 DIAGNOSIS — E119 Type 2 diabetes mellitus without complications: Secondary | ICD-10-CM

## 2023-02-17 ENCOUNTER — Ambulatory Visit: Payer: Self-pay

## 2023-02-17 DIAGNOSIS — Z0000 Encounter for general adult medical examination without abnormal findings: Secondary | ICD-10-CM

## 2023-02-17 LAB — POCT URINALYSIS DIPSTICK
Bilirubin, UA: NEGATIVE
Blood, UA: NEGATIVE
Glucose, UA: NEGATIVE
Ketones, UA: NEGATIVE
Leukocytes, UA: NEGATIVE
Nitrite, UA: NEGATIVE
Protein, UA: POSITIVE — AB
Spec Grav, UA: 1.025 (ref 1.010–1.025)
Urobilinogen, UA: 0.2 E.U./dL
pH, UA: 6 (ref 5.0–8.0)

## 2023-02-18 LAB — CMP12+LP+TP+TSH+6AC+PSA+CBC…
ALT: 16 IU/L (ref 0–44)
AST: 18 IU/L (ref 0–40)
Albumin/Globulin Ratio: 1.4 (ref 1.2–2.2)
Albumin: 4.2 g/dL (ref 3.9–4.9)
Alkaline Phosphatase: 72 IU/L (ref 44–121)
BUN/Creatinine Ratio: 13 (ref 10–24)
BUN: 18 mg/dL (ref 8–27)
Basophils Absolute: 0 10*3/uL (ref 0.0–0.2)
Basos: 0 %
Bilirubin Total: 0.5 mg/dL (ref 0.0–1.2)
Calcium: 9.5 mg/dL (ref 8.6–10.2)
Chloride: 96 mmol/L (ref 96–106)
Chol/HDL Ratio: 4.6 ratio (ref 0.0–5.0)
Cholesterol, Total: 171 mg/dL (ref 100–199)
Creatinine, Ser: 1.36 mg/dL — ABNORMAL HIGH (ref 0.76–1.27)
EOS (ABSOLUTE): 0.3 10*3/uL (ref 0.0–0.4)
Eos: 4 %
Estimated CHD Risk: 0.9 times avg. (ref 0.0–1.0)
Free Thyroxine Index: 1.9 (ref 1.2–4.9)
GGT: 19 IU/L (ref 0–65)
Globulin, Total: 3 g/dL (ref 1.5–4.5)
Glucose: 220 mg/dL — ABNORMAL HIGH (ref 70–99)
HDL: 37 mg/dL — ABNORMAL LOW (ref >39)
Hematocrit: 39.6 % (ref 37.5–51.0)
Hemoglobin: 12.9 g/dL — ABNORMAL LOW (ref 13.0–17.7)
Immature Grans (Abs): 0 10*3/uL (ref 0.0–0.1)
Immature Granulocytes: 0 %
Iron: 65 ug/dL (ref 38–169)
LDH: 140 IU/L (ref 121–224)
LDL Chol Calc (NIH): 93 mg/dL (ref 0–99)
Lymphocytes Absolute: 1.3 10*3/uL (ref 0.7–3.1)
Lymphs: 19 %
MCH: 26.2 pg — ABNORMAL LOW (ref 26.6–33.0)
MCHC: 32.6 g/dL (ref 31.5–35.7)
MCV: 81 fL (ref 79–97)
Monocytes Absolute: 0.8 10*3/uL (ref 0.1–0.9)
Monocytes: 11 %
Neutrophils Absolute: 4.6 10*3/uL (ref 1.4–7.0)
Neutrophils: 66 %
Phosphorus: 4.3 mg/dL — ABNORMAL HIGH (ref 2.8–4.1)
Platelets: 269 10*3/uL (ref 150–450)
Potassium: 3.2 mmol/L — ABNORMAL LOW (ref 3.5–5.2)
Prostate Specific Ag, Serum: 0.8 ng/mL (ref 0.0–4.0)
RBC: 4.92 x10E6/uL (ref 4.14–5.80)
RDW: 13.7 % (ref 11.6–15.4)
Sodium: 140 mmol/L (ref 134–144)
T3 Uptake Ratio: 27 % (ref 24–39)
T4, Total: 7.2 ug/dL (ref 4.5–12.0)
TSH: 4.05 u[IU]/mL (ref 0.450–4.500)
Total Protein: 7.2 g/dL (ref 6.0–8.5)
Triglycerides: 239 mg/dL — ABNORMAL HIGH (ref 0–149)
Uric Acid: 6.3 mg/dL (ref 3.8–8.4)
VLDL Cholesterol Cal: 41 mg/dL — ABNORMAL HIGH (ref 5–40)
WBC: 7 10*3/uL (ref 3.4–10.8)
eGFR: 59 mL/min/{1.73_m2} — ABNORMAL LOW (ref >59)

## 2023-02-20 LAB — SPECIMEN STATUS REPORT

## 2023-02-20 LAB — HGB A1C W/O EAG: Hgb A1c MFr Bld: 10 % — ABNORMAL HIGH (ref 4.8–5.6)

## 2023-02-24 ENCOUNTER — Other Ambulatory Visit: Payer: Self-pay | Admitting: Physician Assistant

## 2023-02-24 ENCOUNTER — Encounter: Payer: Self-pay | Admitting: Physician Assistant

## 2023-02-24 ENCOUNTER — Ambulatory Visit: Payer: Self-pay | Admitting: Physician Assistant

## 2023-02-24 VITALS — BP 160/108 | HR 68 | Temp 98.4°F | Resp 14 | Ht 70.5 in | Wt 217.0 lb

## 2023-02-24 DIAGNOSIS — Z0000 Encounter for general adult medical examination without abnormal findings: Secondary | ICD-10-CM

## 2023-02-24 DIAGNOSIS — E0800 Diabetes mellitus due to underlying condition with hyperosmolarity without nonketotic hyperglycemic-hyperosmolar coma (NKHHC): Secondary | ICD-10-CM

## 2023-02-24 DIAGNOSIS — N529 Male erectile dysfunction, unspecified: Secondary | ICD-10-CM

## 2023-02-24 DIAGNOSIS — E7849 Other hyperlipidemia: Secondary | ICD-10-CM

## 2023-02-24 MED ORDER — POTASSIUM CHLORIDE CRYS ER 20 MEQ PO TBCR: 20.0000 meq | EXTENDED_RELEASE_TABLET | Freq: Every day | ORAL | 3 refills | Status: DC

## 2023-02-24 MED ORDER — METFORMIN HCL 1000 MG PO TABS: 1000.0000 mg | ORAL_TABLET | Freq: Two times a day (BID) | ORAL | 3 refills | Status: DC

## 2023-02-24 MED ORDER — TRAZODONE HCL 150 MG PO TABS: 150.0000 mg | ORAL_TABLET | Freq: Every day | ORAL | 0 refills | Status: DC

## 2023-02-24 MED ORDER — ROSUVASTATIN CALCIUM 40 MG PO TABS: 40.0000 mg | ORAL_TABLET | Freq: Every day | ORAL | 3 refills | Status: DC

## 2023-02-24 NOTE — Progress Notes (Signed)
[facility] ____________________________________________   None    (approximate)  I have reviewed the triage vital signs and the nursing notes.   HISTORY  Chief Complaint Annual Exam   HPI KAMONTE MCMICHEN is a 62 y.o. male patient presents for annual physical exam.  Patient states insomnia secondary to increased life stressors.  Patient recently has patient's mother in a nursing home.  Patient stated wife is due for the surgery.  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  Patient is aware of increased hemoglobin A1c and increased.         Past Medical History:  Diagnosis Date   Diabetes mellitus without complication (HCC)    Hypertension     Patient Active Problem List   Diagnosis Date Noted   Diabetes 08/03/2019   Essential hypertension 08/03/2019   Type 2 diabetes mellitus without complication, without long-term current use of insulin 08/03/2019   Class 1 obesity due to excess calories with serious comorbidity and body mass index (BMI) of 33.0 to 33.9 in adult 08/03/2019   Other hyperlipidemia 08/03/2019   Subclinical hypothyroidism 08/03/2019   Borderline anemia 08/03/2019   History of colonic polyps 08/03/2019    Past Surgical History:  Procedure Laterality Date   CYSTOSCOPY/URETEROSCOPY/HOLMIUM LASER/STENT PLACEMENT Right 10/07/2018   Procedure: CYSTOSCOPY/URETEROSCOPY/HOLMIUM LASER/STENT PLACEMENT;  Surgeon: Riki Altes, MD;  Location: [facility] ORS;  Service: Urology;  Laterality: Right;    Prior to Admission medications   Medication Sig Start Date End Date Taking? Authorizing Provider  glipiZIDE (GLUCOTROL) 10 MG tablet Take 1 tablet (10 mg total) by mouth 2 (two) times daily before a meal. 05/06/22   Joni Reining, PA-C  metFORMIN (GLUCOPHAGE-XR) 500 MG 24 hr tablet Take 2 tablets (1,000 mg total) by mouth daily with breakfast. 05/20/22   Joni Reining, PA-C  metolazone (ZAROXOLYN) 5 MG tablet Take 1 tablet  (5 mg total) by mouth daily. 01/21/23   Joni Reining, PA-C  Multiple Vitamin (MULTIVITAMIN) capsule Take by mouth.    [provider]  naproxen (NAPROSYN) 250 MG tablet Take by mouth 2 (two) times daily with a meal.    [provider]  Phosphatidyl Choline 65 MG TABS Take 65 mg by mouth 2 (two) times daily at 8 am and 10 pm. 12/14/20   Pricilla Handler, MD  potassium chloride SA (KLOR-CON M) 20 MEQ tablet Take 1 tablet (20 mEq total) by mouth daily. 01/02/22   Joni Reining, PA-C  sildenafil (REVATIO) 20 MG tablet Take 1-3 tabs po prn x1 for sex 10/21/22   Joni Reining, PA-C  simvastatin (ZOCOR) 20 MG tablet Take 1 tablet (20 mg total) by mouth daily. 03/01/22   Joni Reining, PA-C  sildenafil (REVATIO) 20 MG tablet Take 1-3 tabs po prn x1 for sex 07/10/21   Joni Reining, PA-C    Allergies Penicillins  No family history on file.  Social History Social History   Tobacco Use   Smoking status: Never   Smokeless tobacco: Never  Substance Use Topics   Drug use: Never    Review of Systems Constitutional: No fever/chills Eyes: No visual changes. ENT: No sore throat. Cardiovascular: Denies chest pain. Respiratory: Denies shortness of breath. Gastrointestinal: No abdominal pain.  No nausea, no vomiting.  No diarrhea.  No constipation. Genitourinary: Negative for dysuria. Musculoskeletal: Negative for back pain. Skin: Negative for rash. Neurological: Negative for headaches, focal weakness or numbness. Psychiatric: Insomnia Endocrine: Diabetes,  hyperlipidemia, and hypertension. Allergic/Immunilogical: Penicillin ____________________________________________   PHYSICAL EXAM:  VITAL SIGNS: Constitutional: Alert and oriented. Well appearing and in no acute distress. Eyes: Conjunctivae are normal. PERRL. EOMI. Head: Atraumatic. Nose: No congestion/rhinnorhea. Mouth/Throat: Mucous membranes are moist.  Oropharynx non-erythematous. Neck: No stridor.  No cervical  spine tenderness to palpation. Hematological/Lymphatic/Immunilogical: No cervical lymphadenopathy. Someone is seeing him back in in a month or I will see him back in 2 weeks cardiovascular: Normal rate, regular rhythm. Grossly normal heart sounds.  Good peripheral circulation. Respiratory: Normal respiratory effort.  No retractions. Lungs CTAB. Gastrointestinal: Soft and nontender. No distention. No abdominal bruits. No CVA tenderness. Genitourinary: Deferred Musculoskeletal: No lower extremity tenderness nor edema.  No joint effusions. Neurologic:  Normal speech and language. No gross focal neurologic deficits are appreciated. No gait instability. Skin:  Skin is warm, dry and intact. No rash noted. Psychiatric: Mood and affect are normal. Speech and behavior are normal.  ____________________________________________   LABS            Component Ref Range & Units 7 d ago (02/17/23) 1 yr ago (08/21/21) 2 yr ago (12/05/20) 3 yr ago (07/27/19) 4 yr ago (10/15/18) 4 yr ago (10/05/18) 4 yr ago (10/02/18)  Color, UA Amb er Dark Yellow Yellow Yellow Yellow R Yellow R   Clarity, UA Clear Clear Clear Clear     Glucose, UA Negative Negative Negative Negative Positive Abnormal  CM     Bilirubin, UA Negative Negative Negative Negative Negative R Negative R   Ketones, UA Negative Negative Negative Negative Negative R Negative R   Spec Grav, UA 1.010 - 1.025 1.025 1.015 1.025 1.020 >1.030 High  R >1.030 High  R   Blood, UA Negative Negative Negative Negative Negative R 3+ Abnormal  R   pH, UA 5.0 - 8.0 6.0 6.0 6.0 6.0 5.0 R 5.5 R   Protein, UA Negative Positive Abnormal  Positive Abnormal  CM Negative Negative Negative R 3+ Abnormal  R   Comment: 1+  Urobilinogen, UA 0.2 or 1.0 E.U./dL 0.2 0.2 0.2 0.2     Nitrite, UA Negative Negative Negative Negative Negative R Negative R   Leukocytes, UA Negative Negative Negative Negative Negative 1+ Abnormal  1+ Abnormal  NEGATIVE R  Appearance        CLEAR Abnormal  R  Odor         Resulting Agency     [facility]                  Component Ref Range & Units 7 d ago (02/17/23) 1 yr ago (12/26/21) 1 yr ago (08/21/21) 2 yr ago (12/05/20) 3 yr ago (02/22/20) 3 yr ago (02/22/20) 3 yr ago (11/17/19)  Glucose 70 - 99 mg/dL 518 High  88 841 High  CM 143 High  R   145 High  R  Uric Acid 3.8 - 8.4 mg/dL 6.3  6.7 CM 5.6 CM   6.8 CM  Comment:            Therapeutic target for gout patients: <6.0  BUN 8 - 27 mg/dL 18 26 21 16  18  R 19 R  Creatinine, Ser 0.76 - 1.27 mg/dL 6.60 High  6.30 High  1.60 High  1.21  1.15 1.11  eGFR >59 mL/min/1.73 59 Low  45 Low  58 Low       BUN/Creatinine Ratio 10 - 24 13 15 15 13  16  R 17 R  Sodium 134 -  144 mmol/L 140 135 139 138   137  Potassium 3.5 - 5.2 mmol/L 3.2 Low  3.3 Low  3.5 4.3   4.5  Chloride 96 - 106 mmol/L 96 105 93 Low  100   99  Calcium 8.6 - 10.2 mg/dL 9.5 9.4 9.6 9.3   9.5 R  Phosphorus 2.8 - 4.1 mg/dL 4.3 High   3.8 3.8   3.5  Total Protein 6.0 - 8.5 g/dL 7.2 7.3 7.3 7.5   7.2  Albumin 3.9 - 4.9 g/dL 4.2 4.5 R 4.3 R 4.3 R   4.3 R  Globulin, Total 1.5 - 4.5 g/dL 3.0 2.8 3.0 3.2   2.9  Albumin/Globulin Ratio 1.2 - 2.2 1.4 1.6 1.4 1.3   1.5  Bilirubin Total 0.0 - 1.2 mg/dL 0.5 0.2 0.4 0.4   0.3  Alkaline Phosphatase 44 - 121 IU/L 72 62 65 73   64 R  LDH 121 - 224 IU/L 140  155 252 High    147  AST 0 - 40 IU/L ALT 0 - 44 IU/L GGT 0 - 65 IU/L Iron 38 - 169 ug/dL 65  76 73   64  Cholesterol, Total 100 - 199 mg/dL 161  096 045   409  Triglycerides 0 - 149 mg/dL 811 High   914 High  782 High    227 High   HDL >39 mg/dL 37 Low   33 Low  38 Low    38 Low   VLDL Cholesterol Cal 5 - 40 mg/dL 41 High   33 36   38  LDL Chol Calc (NIH) 0 - 99 mg/dL 93  83 956 High    86  Chol/HDL Ratio 0.0 - 5.0 ratio 4.6  4.5 CM 4.8 CM   4.3 CM  Comment:                                   T. Chol/HDL Ratio                                              Men  Women                               1/2 Avg.Risk  3.4    3.3                                   Avg.Risk  5.0    4.4                                2X Avg.Risk  9.6    7.1                                3X Avg.Risk 23.4   11.0  Estimated CHD Risk 0.0 - 1.0 times avg. 0.9  0.9 CM 1.0 CM   0.8 CM  Comment: The CHD Risk is based on the  T. Chol/HDL ratio. Other factors affect CHD Risk such as hypertension, smoking, diabetes, severe obesity, and family history of premature CHD.  TSH 0.450 - 4.500 uIU/mL 4.050  5.570 High  4.720 High  4.680 High   4.920 High   T4, Total 4.5 - 12.0 ug/dL 7.2  9.0 7.1   7.3  T3 Uptake Ratio 24 - 39 % 27  25 25   26   Free Thyroxine Index 1.2 - 4.9 1.9  2.3 1.8   1.9  Prostate Specific Ag, Serum 0.0 - 4.0 ng/mL 0.8  0.9 CM 1.1 CM   0.7 CM  Comment: Roche ECLIA methodology. According to the American Urological Association, Serum PSA should decrease and remain at undetectable levels after radical prostatectomy. The AUA defines biochemical recurrence as an initial PSA value 0.2 ng/mL or greater followed by a subsequent confirmatory PSA value 0.2 ng/mL or greater. Values obtained with different assay methods or kits cannot be used interchangeably. Results cannot be interpreted as absolute evidence of the presence or absence of malignant disease.  WBC 3.4 - 10.8 x10E3/uL 7.0  7.9 8.0   7.2  RBC 4.14 - 5.80 x10E6/uL 4.92  5.07 5.39   4.80  Hemoglobin 13.0 - 17.7 g/dL 24.4 Low   01.0 27.2   13.4  Hematocrit 37.5 - 51.0 % 39.6  42.5 44.5   40.2  MCV 79 - 97 fL 81  84 83   84  MCH 26.6 - 33.0 pg 26.2 Low   27.2 27.3   27.9  MCHC 31.5 - 35.7 g/dL 53.6  64.4 03.4   74.2  RDW 11.6 - 15.4 % 13.7  14.0 14.1   12.6  Platelets 150 - 450 x10E3/uL 269  265 242   242  Neutrophils Not Estab. % 66  66 66   63  Lymphs Not Estab. % 19  18 18   19   Monocytes Not Estab. % 11  11 11   12   Eos Not Estab. % 4  4 3   5   Basos Not  Estab. % 0  1 1   1   Neutrophils Absolute 1.4 - 7.0 x10E3/uL 4.6  5.3 5.3   4.6  Lymphocytes Absolute 0.7 - 3.1 x10E3/uL 1.3  1.4 1.5   1.3  Monocytes Absolute 0.1 - 0.9 x10E3/uL 0.8  0.8 0.9   0.8  EOS (ABSOLUTE) 0.0 - 0.4 x10E3/uL 0.3  0.3 0.2   0.4  Basophils Absolute 0.0 - 0.2 x10E3/uL 0.0  0.1 0.0   0.1  Immature Granulocytes Not Estab. % 0  0 1   0  Immature Grans (Abs) 0.0 - 0.1 x10E3/uL 0.0  0.0 0.1   0.0  Resulting Agency [facility]                     ____________________________________________  EKG  Bradycardic at 57 bpm ____________________________________________   ____________________________________________    ____________________________________________   INITIAL IMPRESSION / ASSESSMENT AND PLAN  As part of my medical decision making, I reviewed the following data within the electronic MEDICAL RECORD NUMBER       No acute findings on physical exam.  Discussed diabetes and lipid.  Patient will increase rate control to 10 mg twice daily, metformin 2 1000 mg twice daily, and change from Zocor to Crestor.  Patient will follow-up in 2 weeks for insomnia.  Patient is scheduled for 65-month follow-up lab work.      ____________________________________________   FINAL CLINICAL  IMPRESSION(S) / ED DIAGNOSES  @[facility] @   ED Discharge Orders     None        Note:  This document was prepared using Dragon voice recognition software and may include unintentional dictation errors.

## 2023-02-24 NOTE — Progress Notes (Signed)
Pt presents today to complete physical, Pt has some concerns to address.

## 2023-03-13 ENCOUNTER — Other Ambulatory Visit: Payer: Self-pay

## 2023-03-13 ENCOUNTER — Other Ambulatory Visit: Payer: Self-pay | Admitting: Physician Assistant

## 2023-03-13 MED ORDER — TRAZODONE HCL 150 MG PO TABS: 150.0000 mg | ORAL_TABLET | Freq: Every day | ORAL | 0 refills | Status: DC

## 2023-05-26 ENCOUNTER — Other Ambulatory Visit: Payer: 59

## 2023-07-09 ENCOUNTER — Other Ambulatory Visit: Payer: Self-pay

## 2023-07-09 DIAGNOSIS — G4700 Insomnia, unspecified: Secondary | ICD-10-CM

## 2023-07-09 DIAGNOSIS — N529 Male erectile dysfunction, unspecified: Secondary | ICD-10-CM

## 2023-07-09 DIAGNOSIS — E876 Hypokalemia: Secondary | ICD-10-CM

## 2023-07-09 DIAGNOSIS — E7849 Other hyperlipidemia: Secondary | ICD-10-CM

## 2023-07-09 MED ORDER — ROSUVASTATIN CALCIUM 40 MG PO TABS
40.0000 mg | ORAL_TABLET | Freq: Every day | ORAL | 3 refills | Status: DC
Start: 2023-07-09 — End: 2024-02-09

## 2023-07-09 MED ORDER — POTASSIUM CHLORIDE CRYS ER 20 MEQ PO TBCR
20.0000 meq | EXTENDED_RELEASE_TABLET | Freq: Every day | ORAL | 1 refills | Status: DC
Start: 2023-07-09 — End: 2024-03-11

## 2023-07-09 MED ORDER — SILDENAFIL CITRATE 20 MG PO TABS
ORAL_TABLET | ORAL | 1 refills | Status: AC
Start: 2023-07-09 — End: ?

## 2023-07-09 MED ORDER — TRAZODONE HCL 150 MG PO TABS
150.0000 mg | ORAL_TABLET | Freq: Every day | ORAL | 2 refills | Status: AC
Start: 2023-07-09 — End: ?

## 2023-08-05 DIAGNOSIS — K0263 Dental caries on smooth surface penetrating into pulp: Secondary | ICD-10-CM | POA: Diagnosis not present

## 2023-08-27 ENCOUNTER — Ambulatory Visit: Payer: Self-pay

## 2023-08-27 VITALS — BP 122/72 | HR 67 | Resp 14 | Ht 70.5 in | Wt 190.0 lb

## 2023-08-27 DIAGNOSIS — E7849 Other hyperlipidemia: Secondary | ICD-10-CM

## 2023-08-27 NOTE — Progress Notes (Signed)
Pt presents today for repeat lipid and BP check per dental form.

## 2023-08-28 LAB — LIPID PANEL
Chol/HDL Ratio: 6.8 {ratio} — ABNORMAL HIGH (ref 0.0–5.0)
Cholesterol, Total: 212 mg/dL — ABNORMAL HIGH (ref 100–199)
HDL: 31 mg/dL — ABNORMAL LOW (ref >39)
LDL Chol Calc (NIH): 125 mg/dL — ABNORMAL HIGH (ref 0–99)
Triglycerides: 317 mg/dL — ABNORMAL HIGH (ref 0–149)
VLDL Cholesterol Cal: 56 mg/dL — ABNORMAL HIGH (ref 5–40)

## 2023-10-06 DIAGNOSIS — H5213 Myopia, bilateral: Secondary | ICD-10-CM | POA: Diagnosis not present

## 2023-10-16 ENCOUNTER — Other Ambulatory Visit: Payer: Self-pay | Admitting: Physician Assistant

## 2023-10-16 DIAGNOSIS — E7849 Other hyperlipidemia: Secondary | ICD-10-CM

## 2023-12-03 ENCOUNTER — Other Ambulatory Visit: Payer: Self-pay | Admitting: Physician Assistant

## 2024-01-31 DIAGNOSIS — Z01818 Encounter for other preprocedural examination: Secondary | ICD-10-CM | POA: Diagnosis not present

## 2024-01-31 DIAGNOSIS — R079 Chest pain, unspecified: Secondary | ICD-10-CM | POA: Diagnosis not present

## 2024-01-31 DIAGNOSIS — I10 Essential (primary) hypertension: Secondary | ICD-10-CM | POA: Diagnosis not present

## 2024-01-31 DIAGNOSIS — E785 Hyperlipidemia, unspecified: Secondary | ICD-10-CM | POA: Diagnosis not present

## 2024-01-31 DIAGNOSIS — E119 Type 2 diabetes mellitus without complications: Secondary | ICD-10-CM | POA: Diagnosis not present

## 2024-01-31 DIAGNOSIS — I2510 Atherosclerotic heart disease of native coronary artery without angina pectoris: Secondary | ICD-10-CM | POA: Diagnosis not present

## 2024-01-31 DIAGNOSIS — I213 ST elevation (STEMI) myocardial infarction of unspecified site: Secondary | ICD-10-CM | POA: Diagnosis not present

## 2024-01-31 DIAGNOSIS — Z87891 Personal history of nicotine dependence: Secondary | ICD-10-CM | POA: Diagnosis not present

## 2024-01-31 DIAGNOSIS — Z0000 Encounter for general adult medical examination without abnormal findings: Secondary | ICD-10-CM | POA: Diagnosis not present

## 2024-01-31 DIAGNOSIS — D62 Acute posthemorrhagic anemia: Secondary | ICD-10-CM | POA: Diagnosis not present

## 2024-01-31 DIAGNOSIS — I2111 ST elevation (STEMI) myocardial infarction involving right coronary artery: Secondary | ICD-10-CM | POA: Diagnosis not present

## 2024-01-31 DIAGNOSIS — I82811 Embolism and thrombosis of superficial veins of right lower extremities: Secondary | ICD-10-CM | POA: Diagnosis not present

## 2024-01-31 DIAGNOSIS — R0602 Shortness of breath: Secondary | ICD-10-CM | POA: Diagnosis not present

## 2024-02-01 DIAGNOSIS — Z01818 Encounter for other preprocedural examination: Secondary | ICD-10-CM | POA: Diagnosis not present

## 2024-02-01 DIAGNOSIS — I82811 Embolism and thrombosis of superficial veins of right lower extremities: Secondary | ICD-10-CM | POA: Diagnosis not present

## 2024-02-01 DIAGNOSIS — I2111 ST elevation (STEMI) myocardial infarction involving right coronary artery: Secondary | ICD-10-CM | POA: Diagnosis not present

## 2024-02-01 DIAGNOSIS — D62 Acute posthemorrhagic anemia: Secondary | ICD-10-CM | POA: Diagnosis not present

## 2024-02-01 DIAGNOSIS — I213 ST elevation (STEMI) myocardial infarction of unspecified site: Secondary | ICD-10-CM | POA: Diagnosis not present

## 2024-02-02 DIAGNOSIS — D62 Acute posthemorrhagic anemia: Secondary | ICD-10-CM | POA: Diagnosis not present

## 2024-02-02 DIAGNOSIS — Z951 Presence of aortocoronary bypass graft: Secondary | ICD-10-CM | POA: Diagnosis not present

## 2024-02-02 DIAGNOSIS — J9811 Atelectasis: Secondary | ICD-10-CM | POA: Diagnosis not present

## 2024-02-02 DIAGNOSIS — I213 ST elevation (STEMI) myocardial infarction of unspecified site: Secondary | ICD-10-CM | POA: Diagnosis not present

## 2024-02-02 DIAGNOSIS — I82811 Embolism and thrombosis of superficial veins of right lower extremities: Secondary | ICD-10-CM | POA: Diagnosis not present

## 2024-02-02 DIAGNOSIS — E1122 Type 2 diabetes mellitus with diabetic chronic kidney disease: Secondary | ICD-10-CM | POA: Diagnosis not present

## 2024-02-02 DIAGNOSIS — I2111 ST elevation (STEMI) myocardial infarction involving right coronary artery: Secondary | ICD-10-CM | POA: Diagnosis not present

## 2024-02-02 DIAGNOSIS — I25118 Atherosclerotic heart disease of native coronary artery with other forms of angina pectoris: Secondary | ICD-10-CM | POA: Diagnosis not present

## 2024-02-02 DIAGNOSIS — Z48812 Encounter for surgical aftercare following surgery on the circulatory system: Secondary | ICD-10-CM | POA: Diagnosis not present

## 2024-02-02 DIAGNOSIS — Z9911 Dependence on respirator [ventilator] status: Secondary | ICD-10-CM | POA: Diagnosis not present

## 2024-02-02 DIAGNOSIS — N189 Chronic kidney disease, unspecified: Secondary | ICD-10-CM | POA: Diagnosis not present

## 2024-02-03 DIAGNOSIS — Z951 Presence of aortocoronary bypass graft: Secondary | ICD-10-CM | POA: Diagnosis not present

## 2024-02-03 DIAGNOSIS — I82811 Embolism and thrombosis of superficial veins of right lower extremities: Secondary | ICD-10-CM | POA: Diagnosis not present

## 2024-02-03 DIAGNOSIS — Z452 Encounter for adjustment and management of vascular access device: Secondary | ICD-10-CM | POA: Diagnosis not present

## 2024-02-03 DIAGNOSIS — Z4682 Encounter for fitting and adjustment of non-vascular catheter: Secondary | ICD-10-CM | POA: Diagnosis not present

## 2024-02-03 DIAGNOSIS — D62 Acute posthemorrhagic anemia: Secondary | ICD-10-CM | POA: Diagnosis not present

## 2024-02-03 DIAGNOSIS — I2111 ST elevation (STEMI) myocardial infarction involving right coronary artery: Secondary | ICD-10-CM | POA: Diagnosis not present

## 2024-02-03 DIAGNOSIS — Z48812 Encounter for surgical aftercare following surgery on the circulatory system: Secondary | ICD-10-CM | POA: Diagnosis not present

## 2024-02-03 DIAGNOSIS — I213 ST elevation (STEMI) myocardial infarction of unspecified site: Secondary | ICD-10-CM | POA: Diagnosis not present

## 2024-02-04 DIAGNOSIS — I25118 Atherosclerotic heart disease of native coronary artery with other forms of angina pectoris: Secondary | ICD-10-CM | POA: Diagnosis not present

## 2024-02-04 DIAGNOSIS — I213 ST elevation (STEMI) myocardial infarction of unspecified site: Secondary | ICD-10-CM | POA: Diagnosis not present

## 2024-02-04 DIAGNOSIS — D62 Acute posthemorrhagic anemia: Secondary | ICD-10-CM | POA: Diagnosis not present

## 2024-02-04 DIAGNOSIS — I2111 ST elevation (STEMI) myocardial infarction involving right coronary artery: Secondary | ICD-10-CM | POA: Diagnosis not present

## 2024-02-04 DIAGNOSIS — I82811 Embolism and thrombosis of superficial veins of right lower extremities: Secondary | ICD-10-CM | POA: Diagnosis not present

## 2024-02-05 DIAGNOSIS — Z48812 Encounter for surgical aftercare following surgery on the circulatory system: Secondary | ICD-10-CM | POA: Diagnosis not present

## 2024-02-05 DIAGNOSIS — I2111 ST elevation (STEMI) myocardial infarction involving right coronary artery: Secondary | ICD-10-CM | POA: Diagnosis not present

## 2024-02-05 DIAGNOSIS — Z4682 Encounter for fitting and adjustment of non-vascular catheter: Secondary | ICD-10-CM | POA: Diagnosis not present

## 2024-02-05 DIAGNOSIS — I82811 Embolism and thrombosis of superficial veins of right lower extremities: Secondary | ICD-10-CM | POA: Diagnosis not present

## 2024-02-05 DIAGNOSIS — I213 ST elevation (STEMI) myocardial infarction of unspecified site: Secondary | ICD-10-CM | POA: Diagnosis not present

## 2024-02-05 DIAGNOSIS — Z951 Presence of aortocoronary bypass graft: Secondary | ICD-10-CM | POA: Diagnosis not present

## 2024-02-05 DIAGNOSIS — R0989 Other specified symptoms and signs involving the circulatory and respiratory systems: Secondary | ICD-10-CM | POA: Diagnosis not present

## 2024-02-05 DIAGNOSIS — D62 Acute posthemorrhagic anemia: Secondary | ICD-10-CM | POA: Diagnosis not present

## 2024-02-06 DIAGNOSIS — I2111 ST elevation (STEMI) myocardial infarction involving right coronary artery: Secondary | ICD-10-CM | POA: Diagnosis not present

## 2024-02-06 DIAGNOSIS — I82811 Embolism and thrombosis of superficial veins of right lower extremities: Secondary | ICD-10-CM | POA: Diagnosis not present

## 2024-02-06 DIAGNOSIS — D62 Acute posthemorrhagic anemia: Secondary | ICD-10-CM | POA: Diagnosis not present

## 2024-02-06 DIAGNOSIS — I213 ST elevation (STEMI) myocardial infarction of unspecified site: Secondary | ICD-10-CM | POA: Diagnosis not present

## 2024-02-09 ENCOUNTER — Other Ambulatory Visit: Payer: Self-pay

## 2024-02-09 DIAGNOSIS — E119 Type 2 diabetes mellitus without complications: Secondary | ICD-10-CM

## 2024-02-09 DIAGNOSIS — E7849 Other hyperlipidemia: Secondary | ICD-10-CM

## 2024-02-09 MED ORDER — METFORMIN HCL 1000 MG PO TABS: 1000.0000 mg | ORAL_TABLET | Freq: Two times a day (BID) | ORAL | 0 refills | Status: AC

## 2024-02-09 MED ORDER — ROSUVASTATIN CALCIUM 40 MG PO TABS: 40.0000 mg | ORAL_TABLET | Freq: Every day | ORAL | 0 refills | Status: DC

## 2024-03-02 DIAGNOSIS — D62 Acute posthemorrhagic anemia: Secondary | ICD-10-CM | POA: Diagnosis not present

## 2024-03-02 DIAGNOSIS — I213 ST elevation (STEMI) myocardial infarction of unspecified site: Secondary | ICD-10-CM | POA: Diagnosis not present

## 2024-03-02 DIAGNOSIS — I2111 ST elevation (STEMI) myocardial infarction involving right coronary artery: Secondary | ICD-10-CM | POA: Diagnosis not present

## 2024-03-02 DIAGNOSIS — J90 Pleural effusion, not elsewhere classified: Secondary | ICD-10-CM | POA: Diagnosis not present

## 2024-03-02 DIAGNOSIS — I82811 Embolism and thrombosis of superficial veins of right lower extremities: Secondary | ICD-10-CM | POA: Diagnosis not present

## 2024-03-10 ENCOUNTER — Encounter (HOSPITAL_COMMUNITY): Payer: Self-pay

## 2024-03-11 ENCOUNTER — Encounter (HOSPITAL_COMMUNITY)
Admission: RE | Admit: 2024-03-11 | Discharge: 2024-03-11 | Disposition: A | Discharge status: Home / Self Care | Source: Ambulatory Visit

## 2024-03-11 DIAGNOSIS — I2111 ST elevation (STEMI) myocardial infarction involving right coronary artery: Secondary | ICD-10-CM | POA: Insufficient documentation

## 2024-03-11 DIAGNOSIS — I252 Old myocardial infarction: Secondary | ICD-10-CM | POA: Insufficient documentation

## 2024-03-11 DIAGNOSIS — Z951 Presence of aortocoronary bypass graft: Secondary | ICD-10-CM | POA: Insufficient documentation

## 2024-03-11 DIAGNOSIS — Z48812 Encounter for surgical aftercare following surgery on the circulatory system: Secondary | ICD-10-CM | POA: Insufficient documentation

## 2024-03-11 NOTE — Progress Notes (Signed)
 Completed virtual orientation today.  EP evaluation is scheduled for Tuesday 03/18/24 at 830 .  Documentation for diagnosis can be found in [facility] for [facility] 01/31/24 .

## 2024-03-16 ENCOUNTER — Encounter (HOSPITAL_COMMUNITY)
Admission: RE | Admit: 2024-03-16 | Discharge: 2024-03-16 | Disposition: A | Discharge status: Home / Self Care | Source: Ambulatory Visit

## 2024-03-16 VITALS — Ht 70.5 in | Wt 185.9 lb

## 2024-03-16 DIAGNOSIS — Z48812 Encounter for surgical aftercare following surgery on the circulatory system: Secondary | ICD-10-CM | POA: Diagnosis not present

## 2024-03-16 DIAGNOSIS — Z951 Presence of aortocoronary bypass graft: Secondary | ICD-10-CM | POA: Diagnosis present

## 2024-03-16 DIAGNOSIS — I2111 ST elevation (STEMI) myocardial infarction involving right coronary artery: Secondary | ICD-10-CM

## 2024-03-16 DIAGNOSIS — I252 Old myocardial infarction: Secondary | ICD-10-CM | POA: Diagnosis not present

## 2024-03-16 NOTE — Patient Instructions (Signed)
 Patient Instructions  Patient Details  Name: Aaron Smith MRN: [id number] Date of Birth: August 11, 1961 Referring Provider:  Owen Blowers., MD  Below are your personal goals for exercise, nutrition, and risk factors. Our goal is to help you stay on track towards obtaining and maintaining these goals. We will be discussing your progress on these goals with you throughout the program.  Initial Exercise Prescription:  Initial Exercise Prescription - 03/16/24 0900       Date of Initial Exercise RX and Referring Provider   Date 03/16/24    Referring Provider Anastacio Balm MD   [facility]     Oxygen   Maintain Oxygen Saturation 88% or higher      Treadmill   MPH 2.5    Grade 1    Minutes 15    METs 3.26      Elliptical   Level 1    Speed 50    Minutes 15    METs 3.3      REL-XR   Level 3    Speed 50    Minutes 15    METs 3.3      Prescription Details   Frequency (times per week) 3    Duration Progress to 30 minutes of continuous aerobic without signs/symptoms of physical distress      Intensity   THRR 40-80% of Max Heartrate 107-140    Ratings of Perceived Exertion 11-13    Perceived Dyspnea 0-4      Progression   Progression Continue to progress workloads to maintain intensity without signs/symptoms of physical distress.      Resistance Training   Training Prescription Yes    Weight 3 lb    Reps 10-15             Exercise Goals: Frequency: Be able to perform aerobic exercise two to three times per week in program working toward 2-5 days per week of home exercise.  Intensity: Work with a perceived exertion of 11 (fairly light) - 15 (hard) while following your exercise prescription.  We will make changes to your prescription with you as you progress through the program.   Duration: Be able to do 30 to 45 minutes of continuous aerobic exercise in addition to a 5 minute warm-up and a 5 minute cool-down routine.   Nutrition Goals: Your personal nutrition  goals will be established when you do your nutrition analysis with the dietician.  The following are general nutrition guidelines to follow: Cholesterol < 200mg /day Sodium < 1500mg /day Fiber: Men over 50 yrs - 30 grams per day  Personal Goals:  Personal Goals and Risk Factors at Admission - 03/16/24 0936       Core Components/Risk Factors/Patient Goals on Admission    Weight Management Yes;Weight Loss;Weight Maintenance    Intervention Weight Management: Develop a combined nutrition and exercise program designed to reach desired caloric intake, while maintaining appropriate intake of nutrient and fiber, sodium and fats, and appropriate energy expenditure required for the weight goal.;Weight Management: Provide education and appropriate resources to help participant work on and attain dietary goals.;Weight Management/Obesity: Establish reasonable short term and long term weight goals.    Admit Weight 185 lb 14.4 oz (84.3 kg)    Goal Weight: Short Term 180 lb (81.6 kg)    Goal Weight: Long Term 180 lb (81.6 kg)    Expected Outcomes Short Term: Continue to assess and modify interventions until short term weight is achieved;Long Term: Adherence to nutrition and physical activity/exercise program  aimed toward attainment of established weight goal;Weight Loss: Understanding of general recommendations for a balanced deficit meal plan, which promotes 1-2 lb weight loss per week and includes a negative energy balance of [phone] kcal/d;Understanding recommendations for meals to include 15-35% energy as protein, 25-35% energy from fat, 35-60% energy from carbohydrates, less than 200mg  of dietary cholesterol, 20-35 gm of total fiber daily;Understanding of distribution of calorie intake throughout the day with the consumption of 4-5 meals/snacks;Weight Maintenance: Understanding of the daily nutrition guidelines, which includes 25-35% calories from fat, 7% or less cal from saturated fats, less than 200mg   cholesterol, less than 1.5gm of sodium, & 5 or more servings of fruits and vegetables daily    Diabetes Yes    Intervention Provide education about signs/symptoms and action to take for hypo/hyperglycemia.;Provide education about proper nutrition, including hydration, and aerobic/resistive exercise prescription along with prescribed medications to achieve blood glucose in normal ranges: Fasting glucose 65-99 mg/dL    Expected Outcomes Short Term: Participant verbalizes understanding of the signs/symptoms and immediate care of hyper/hypoglycemia, proper foot care and importance of medication, aerobic/resistive exercise and nutrition plan for blood glucose control.;Long Term: Attainment of HbA1C < 7%.    Hypertension Yes    Intervention Provide education on lifestyle modifcations including regular physical activity/exercise, weight management, moderate sodium restriction and increased consumption of fresh fruit, vegetables, and low fat dairy, alcohol moderation, and smoking cessation.;Monitor prescription use compliance.    Expected Outcomes Short Term: Continued assessment and intervention until BP is < 140/63mm HG in hypertensive participants. < 130/66mm HG in hypertensive participants with diabetes, heart failure or chronic kidney disease.;Long Term: Maintenance of blood pressure at goal levels.    Lipids Yes    Intervention Provide education and support for participant on nutrition & aerobic/resistive exercise along with prescribed medications to achieve LDL 70mg , HDL >40mg .    Expected Outcomes Short Term: Participant states understanding of desired cholesterol values and is compliant with medications prescribed. Participant is following exercise prescription and nutrition guidelines.;Long Term: Cholesterol controlled with medications as prescribed, with individualized exercise RX and with personalized nutrition plan. Value goals: LDL < 70mg , HDL > 40 mg.             Tobacco Use Initial  Evaluation: Social History   Tobacco Use  Smoking Status Never  Smokeless Tobacco Never    Exercise Goals and Review:  Exercise Goals     Row Name 03/11/24 0814             Exercise Goals   Increase Physical Activity Yes       Intervention Provide advice, education, support and counseling about physical activity/exercise needs.;Develop an individualized exercise prescription for aerobic and resistive training based on initial evaluation findings, risk stratification, comorbidities and participant's personal goals.       Expected Outcomes Short Term: Attend rehab on a regular basis to increase amount of physical activity.;Long Term: Exercising regularly at least 3-5 days a week.;Long Term: Add in home exercise to make exercise part of routine and to increase amount of physical activity.       Increase Strength and Stamina Yes       Intervention Provide advice, education, support and counseling about physical activity/exercise needs.;Develop an individualized exercise prescription for aerobic and resistive training based on initial evaluation findings, risk stratification, comorbidities and participant's personal goals.       Expected Outcomes Short Term: Increase workloads from initial exercise prescription for resistance, speed, and METs.;Short Term: Perform resistance training exercises  routinely during rehab and add in resistance training at home;Long Term: Improve cardiorespiratory fitness, muscular endurance and strength as measured by increased METs and functional capacity ( )       Able to understand and use rate of perceived exertion (RPE) scale Yes       Intervention Provide education and explanation on how to use RPE scale       Expected Outcomes Short Term: Able to use RPE daily in rehab to express subjective intensity level;Long Term:  Able to use RPE to guide intensity level when exercising independently       Able to understand and use Dyspnea scale Yes       Intervention  Provide education and explanation on how to use Dyspnea scale       Expected Outcomes Short Term: Able to use Dyspnea scale daily in rehab to express subjective sense of shortness of breath during exertion;Long Term: Able to use Dyspnea scale to guide intensity level when exercising independently       Knowledge and understanding of Target Heart Rate Range (THRR) Yes       Intervention Provide education and explanation of THRR including how the numbers were predicted and where they are located for reference       Expected Outcomes Short Term: Able to state/look up THRR;Short Term: Able to use daily as guideline for intensity in rehab;Long Term: Able to use THRR to govern intensity when exercising independently       Able to check pulse independently Yes       Intervention Provide education and demonstration on how to check pulse in carotid and radial arteries.;Review the importance of being able to check your own pulse for safety during independent exercise       Expected Outcomes Short Term: Able to explain why pulse checking is important during independent exercise;Long Term: Able to check pulse independently and accurately       Understanding of Exercise Prescription Yes       Intervention Provide education, explanation, and written materials on patient's individual exercise prescription       Expected Outcomes Short Term: Able to explain program exercise prescription;Long Term: Able to explain home exercise prescription to exercise independently              Copy of goals given to participant.

## 2024-03-16 NOTE — Progress Notes (Signed)
 Cardiac Individual Treatment Plan  Patient Details  Name: Aaron Smith MRN: [id number] Date of Birth: 03/15/1961 Referring Provider:   Flowsheet Row CARDIAC REHAB PHASE II ORIENTATION from 03/16/2024 in [facility] CARDIAC REHABILITATION  Referring Provider Anastacio Balm MD  [[place]]       Initial Encounter Date:  Flowsheet Row CARDIAC REHAB PHASE II ORIENTATION from 03/16/2024 in [place] CARDIAC REHABILITATION  Date 03/16/24       Visit Diagnosis: ST elevation myocardial infarction involving right coronary artery (HCC)  S/P CABG x 5  Patient's Home Medications on Admission:  Current Outpatient Medications:    ASPIRIN LOW DOSE 81 MG tablet, Take 81 mg by mouth every morning., Disp: , Rfl:    Blood Glucose Monitoring Suppl (ACCU-CHEK GUIDE ME) w/Device KIT, 1 each by Does not apply route., Disp: , Rfl:    clindamycin (CLEOCIN) 300 MG capsule, , Disp: , Rfl:    clopidogrel (PLAVIX) 75 MG tablet, Take 75 mg by mouth daily., Disp: , Rfl:    Continuous Glucose Sensor (DEXCOM G7 SENSOR) MISC, 1 each by Does not apply route., Disp: , Rfl:    ferrous sulfate 325 (65 FE) MG tablet, Take 325 mg by mouth., Disp: , Rfl:    insulin aspart (NOVOLOG) 100 UNIT/ML FlexPen, Inject 4 Units into the skin., Disp: , Rfl:    Insulin Glargine Solostar (LANTUS) 100 UNIT/ML Solostar Pen, Inject 24 Units into the skin., Disp: , Rfl:    losartan (COZAAR) 25 MG tablet, Take 25 mg by mouth every morning., Disp: , Rfl:    Melatonin 5 MG CHEW, Chew 5 mg by mouth daily as needed (for sleep)., Disp: , Rfl:    metFORMIN  (GLUCOPHAGE ) 1000 MG tablet, Take 1 tablet (1,000 mg total) by mouth 2 (two) times daily with a meal., Disp: 180 tablet, Rfl: 0   metolazone  (ZAROXOLYN ) 5 MG tablet, Take 1 tablet (5 mg total) by mouth daily., Disp: 90 tablet, Rfl: 3   Multiple Vitamin (MULTIVITAMIN) capsule, Take by mouth., Disp: , Rfl:    naproxen (NAPROSYN) 250 MG tablet, Take by mouth 2 (two) times daily with a meal.,  Disp: , Rfl:    rosuvastatin  (CRESTOR ) 40 MG tablet, Take 1 tablet (40 mg total) by mouth daily., Disp: 90 tablet, Rfl: 0   sildenafil  (REVATIO ) 20 MG tablet, Take 1-3 tabs po prn x1 for sex, Disp: 90 tablet, Rfl: 1   traZODone  (DESYREL ) 150 MG tablet, Take 1 tablet (150 mg total) by mouth at bedtime., Disp: 15 tablet, Rfl: 2   docusate sodium (COLACE) 100 MG capsule, Take 100 mg by mouth 2 (two) times daily as needed. (Patient not taking: Reported on 03/16/2024), Disp: , Rfl:   Past Medical History: Past Medical History:  Diagnosis Date   Diabetes mellitus without complication (HCC)    Hypertension     Tobacco Use: Social History   Tobacco Use  Smoking Status Never  Smokeless Tobacco Never    Labs: Review Flowsheet  More data exists      Latest Ref Rng & Units 12/05/2020 08/21/2021 12/26/2021 02/17/2023 08/27/2023  Labs for ITP Cardiac and Pulmonary Rehab  Cholestrol 100 - 199 mg/dL 409  811  - 914  782   LDL (calc) 0 - 99 mg/dL 956  83  - 93  213   HDL-C >39 mg/dL 38  33  - 37  31   Trlycerides 0 - 149 mg/dL 086  578  - 469  629   Hemoglobin A1c  4.8 - 5.6 % 8.5  9.4  7.3  10.0  -    Capillary Blood Glucose: Lab Results  Component Value Date   GLUCAP 170 (H) 10/07/2018   GLUCAP 145 (H) 10/07/2018     Exercise Target Goals: Exercise Program Goal: Individual exercise prescription set using results from initial 6 min walk test and THRR while considering  patient's activity barriers and safety.   Exercise Prescription Goal: Starting with aerobic activity 30 plus minutes a day, 3 days per week for initial exercise prescription. Provide home exercise prescription and guidelines that participant acknowledges understanding prior to discharge.  Activity Barriers & Risk Stratification:  Activity Barriers & Cardiac Risk Stratification - 03/16/24 0932       Activity Barriers & Cardiac Risk Stratification   Activity Barriers Incisional Pain;Deconditioning    Cardiac Risk  Stratification High             6 Minute Walk:  6 Minute Walk     Row Name 03/16/24 0932         6 Minute Walk   Phase Initial     Distance 1345 feet     Walk Time 6 minutes     # of Rest Breaks 0     MPH 2.55     METS 3.34     RPE 9     Perceived Dyspnea  0     VO2 Peak 11.68     Symptoms No     Resting HR 73 bpm     Resting BP 116/58     Resting Oxygen Saturation  99 %     Exercise Oxygen Saturation  during 6 min walk 96 %     Max Ex. HR 86 bpm     Max Ex. BP 128/70     2 Minute Post BP 126/72              Oxygen Initial Assessment:   Oxygen Re-Evaluation:   Oxygen Discharge (Final Oxygen Re-Evaluation):   Initial Exercise Prescription:  Initial Exercise Prescription - 03/16/24 0900       Date of Initial Exercise RX and Referring Provider   Date 03/16/24    Referring Provider Anastacio Balm MD   [facility], [place]     Oxygen   Maintain Oxygen Saturation 88% or higher      Treadmill   MPH 2.5    Grade 1    Minutes 15    METs 3.26      Elliptical   Level 1    Speed 50    Minutes 15    METs 3.3      REL-XR   Level 3    Speed 50    Minutes 15    METs 3.3      Prescription Details   Frequency (times per week) 3    Duration Progress to 30 minutes of continuous aerobic without signs/symptoms of physical distress      Intensity   THRR 40-80% of Max Heartrate 107-140    Ratings of Perceived Exertion 11-13    Perceived Dyspnea 0-4      Progression   Progression Continue to progress workloads to maintain intensity without signs/symptoms of physical distress.      Resistance Training   Training Prescription Yes    Weight 3 lb    Reps 10-15             Perform Capillary Blood Glucose checks as needed.  Exercise Prescription Changes:  Exercise Prescription Changes     Row Name 03/16/24 0900             Response to Exercise   Blood Pressure (Admit) 116/58       Blood Pressure (Exercise) 128/70       Blood Pressure (Exit)  126/72       Heart Rate (Admit) 73 bpm       Heart Rate (Exercise) 86 bpm       Heart Rate (Exit) 73 bpm       Oxygen Saturation (Admit) 99 %       Oxygen Saturation (Exercise) 96 %       Rating of Perceived Exertion (Exercise) 9       Perceived Dyspnea (Exercise) 0       Symptoms none       Comments walk test results                Exercise Comments:   Exercise Goals and Review:   Exercise Goals     Row Name 03/11/24 0814             Exercise Goals   Increase Physical Activity Yes       Intervention Provide advice, education, support and counseling about physical activity/exercise needs.;Develop an individualized exercise prescription for aerobic and resistive training based on initial evaluation findings, risk stratification, comorbidities and participant's personal goals.       Expected Outcomes Short Term: Attend rehab on a regular basis to increase amount of physical activity.;Long Term: Exercising regularly at least 3-5 days a week.;Long Term: Add in home exercise to make exercise part of routine and to increase amount of physical activity.       Increase Strength and Stamina Yes       Intervention Provide advice, education, support and counseling about physical activity/exercise needs.;Develop an individualized exercise prescription for aerobic and resistive training based on initial evaluation findings, risk stratification, comorbidities and participant's personal goals.       Expected Outcomes Short Term: Increase workloads from initial exercise prescription for resistance, speed, and METs.;Short Term: Perform resistance training exercises routinely during rehab and add in resistance training at home;Long Term: Improve cardiorespiratory fitness, muscular endurance and strength as measured by increased METs and functional capacity ( )       Able to understand and use rate of perceived exertion (RPE) scale Yes       Intervention Provide education and explanation on how  to use RPE scale       Expected Outcomes Short Term: Able to use RPE daily in rehab to express subjective intensity level;Long Term:  Able to use RPE to guide intensity level when exercising independently       Able to understand and use Dyspnea scale Yes       Intervention Provide education and explanation on how to use Dyspnea scale       Expected Outcomes Short Term: Able to use Dyspnea scale daily in rehab to express subjective sense of shortness of breath during exertion;Long Term: Able to use Dyspnea scale to guide intensity level when exercising independently       Knowledge and understanding of Target Heart Rate Range (THRR) Yes       Intervention Provide education and explanation of THRR including how the numbers were predicted and where they are located for reference       Expected Outcomes Short Term: Able to state/look up THRR;Short Term: Able to use daily as guideline for  intensity in rehab;Long Term: Able to use THRR to govern intensity when exercising independently       Able to check pulse independently Yes       Intervention Provide education and demonstration on how to check pulse in carotid and radial arteries.;Review the importance of being able to check your own pulse for safety during independent exercise       Expected Outcomes Short Term: Able to explain why pulse checking is important during independent exercise;Long Term: Able to check pulse independently and accurately       Understanding of Exercise Prescription Yes       Intervention Provide education, explanation, and written materials on patient's individual exercise prescription       Expected Outcomes Short Term: Able to explain program exercise prescription;Long Term: Able to explain home exercise prescription to exercise independently                Exercise Goals Re-Evaluation :    Discharge Exercise Prescription (Final Exercise Prescription Changes):  Exercise Prescription Changes - 03/16/24 0900        Response to Exercise   Blood Pressure (Admit) 116/58    Blood Pressure (Exercise) 128/70    Blood Pressure (Exit) 126/72    Heart Rate (Admit) 73 bpm    Heart Rate (Exercise) 86 bpm    Heart Rate (Exit) 73 bpm    Oxygen Saturation (Admit) 99 %    Oxygen Saturation (Exercise) 96 %    Rating of Perceived Exertion (Exercise) 9    Perceived Dyspnea (Exercise) 0    Symptoms none    Comments walk test results             Nutrition:  Target Goals: Understanding of nutrition guidelines, daily intake of sodium 1500mg , cholesterol 200mg , calories 30% from fat and 7% or less from saturated fats, daily to have 5 or more servings of fruits and vegetables.  Biometrics:  Pre Biometrics - 03/16/24 0935       Pre Biometrics   Height 5' 10.5" (1.791 m)    Weight 84.3 kg    Waist Circumference 36 inches    Hip Circumference 39 inches    Waist to Hip Ratio 0.92 %    BMI (Calculated) 26.29    Grip Strength 29.3 kg    Single Leg Stand 30 seconds              Nutrition Therapy Plan and Nutrition Goals:  Nutrition Therapy & Goals - 03/11/24 0823       Intervention Plan   Intervention Prescribe, educate and counsel regarding individualized specific dietary modifications aiming towards targeted core components such as weight, hypertension, lipid management, diabetes, heart failure and other comorbidities.;Nutrition handout(s) given to patient.    Expected Outcomes Short Term Goal: Understand basic principles of dietary content, such as calories, fat, sodium, cholesterol and nutrients.;Long Term Goal: Adherence to prescribed nutrition plan.             Nutrition Assessments:  MEDIFICTS Score Key: >=70 Need to make dietary changes  40-70 Heart Healthy Diet <= 40 Therapeutic Level Cholesterol Diet  Flowsheet Row CARDIAC REHAB PHASE II ORIENTATION from 03/16/2024 in [facility] CARDIAC REHABILITATION  Picture Your Plate Total Score on Admission 61      Picture Your Plate  Scores: <65 Unhealthy dietary pattern with much room for improvement. 41-50 Dietary pattern unlikely to meet recommendations for good health and room for improvement. 51-60 More healthful dietary pattern, with some room for  improvement.  >60 Healthy dietary pattern, although there may be some specific behaviors that could be improved.    Nutrition Goals Re-Evaluation:   Nutrition Goals Discharge (Final Nutrition Goals Re-Evaluation):   Psychosocial: Target Goals: Acknowledge presence or absence of significant depression and/or stress, maximize coping skills, provide positive support system. Participant is able to verbalize types and ability to use techniques and skills needed for reducing stress and depression.  Initial Review & Psychosocial Screening:  Initial Psych Review & Screening - 03/11/24 0815       Initial Review   Current issues with Current Sleep Concerns;Current Stress Concerns    Source of Stress Concerns Unable to participate in former interests or hobbies;Unable to perform yard/household activities    Comments not a back sleeper and having a difficult time to sleep, restritctions to activity      Family Dynamics   Good Support System? Yes   wife     Barriers   Psychosocial barriers to participate in program The patient should benefit from training in stress management and relaxation.;Psychosocial barriers identified (see note)      Screening Interventions   Interventions Encouraged to exercise;To provide support and resources with identified psychosocial needs;Provide feedback about the scores to participant    Expected Outcomes Short Term goal: Utilizing psychosocial counselor, staff and physician to assist with identification of specific Stressors or current issues interfering with healing process. Setting desired goal for each stressor or current issue identified.;Long Term Goal: Stressors or current issues are controlled or eliminated.;Short Term goal:  Identification and review with participant of any Quality of Life or Depression concerns found by scoring the questionnaire.;Long Term goal: The participant improves quality of Life and PHQ9 Scores as seen by post scores and/or verbalization of changes             Quality of Life Scores:  Quality of Life - 03/16/24 0935       Quality of Life   Select Quality of Life      Quality of Life Scores   Health/Function Pre 17.2 %    Socioeconomic Pre 27.75 %    Psych/Spiritual Pre 24 %    Family Pre 24 %    GLOBAL Pre 21.94 %            Scores of 19 and below usually indicate a poorer quality of life in these areas.  A difference of  2-3 points is a clinically meaningful difference.  A difference of 2-3 points in the total score of the Quality of Life Index has been associated with significant improvement in overall quality of life, self-image, physical symptoms, and general health in studies assessing change in quality of life.  PHQ-9: Review Flowsheet       03/16/2024  Depression screen PHQ 2/9  Decreased Interest 1  Down, Depressed, Hopeless 1  PHQ - 2 Score 2  Altered sleeping 3  Tired, decreased energy 2  Change in appetite 2  Feeling bad or failure about yourself  1  Trouble concentrating 1  Moving slowly or fidgety/restless 1  Suicidal thoughts 0  PHQ-9 Score 12  Difficult doing work/chores Somewhat difficult   Interpretation of Total Score  Total Score Depression Severity:  1-4 = Minimal depression, 5-9 = Mild depression, 10-14 = Moderate depression, 15-19 = Moderately severe depression, 20-27 = Severe depression   Psychosocial Evaluation and Intervention:  Psychosocial Evaluation - 03/11/24 0843       Psychosocial Evaluation & Interventions   Interventions Stress management  education;Encouraged to exercise with the program and follow exercise prescription    Comments Sid is coming into cardiac rehab after STEMI and bypass surgery in March.  [place] his heart attack and presented at [facility] then transported to [facility] in [place]. He is still having some incisional soreness since surgery, especially on right side.  Since surgery, he has stopped his insulin after having a few episodes of 40s-60s and talking with his doctor.  He is eager to get moving again and wants to build his strength back up.  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  He is also having a hard time sleeping since surgery.  He usually only gets a few hours a night.  He has been sleeping in bed and recliner on his back but is usually a side sleeper.  He really wants to get back to sleeping.  He has tried some home remedies, but with occassional help and exhaustion to get some rest.  We talked about using a supported side sleeping position to not harm chest as he is transitioning his sleep position to try to get more rest.  He is looking forward to program and learning his new limitiations and how hard to push himself.  His wife has been his support through all this and she plans to attend orientation with him as well.  She is also on him about taking all of his medications and montioring his blood sugars.  He is now using a Dexcom and has found it very helpful in maintain his sugars.  He will only use his insulin on the rare occassions that his sugar is too high.    Expected Outcomes Short: Attend rehab to build up strength and stamina Long: Continue to cope with restrictions until lifted    Continue Psychosocial Services  Follow up required by staff             Psychosocial Re-Evaluation:   Psychosocial Discharge (Final Psychosocial Re-Evaluation):   Vocational Rehabilitation: Provide vocational rehab assistance to qualifying candidates.   Vocational Rehab Evaluation & Intervention:   Vocational Rehab - 03/11/24 4098       Initial Vocational Rehab Evaluation & Intervention   Assessment shows need for Vocational Rehabilitation No   retired            Education: Education Goals: Education classes will be provided on a weekly basis, covering required topics. Participant will state understanding/return demonstration of topics presented.  Learning Barriers/Preferences:  Learning Barriers/Preferences - 03/11/24 0810       Learning Barriers/Preferences   Learning Barriers Sight   contacts and reading glasses   Learning Preferences None             Education Topics: Hypertension, Hypertension Reduction -Define heart disease and high blood pressure. Discus how high blood pressure affects the body and ways to reduce high blood pressure.   Exercise and Your Heart -Discuss why it is important to exercise, the FITT principles of exercise, normal and abnormal responses to exercise, and how to exercise safely.   Angina -Discuss definition of angina, causes of angina, treatment of angina, and how to decrease risk of having angina.   Cardiac Medications -Review what the following cardiac medications are used  for, how they affect the body, and side effects that may occur when taking the medications.  Medications include Aspirin, Beta blockers, calcium  channel blockers, ACE Inhibitors, angiotensin receptor blockers, diuretics, digoxin, and antihyperlipidemics.   Congestive Heart Failure -Discuss the definition of CHF, how to live with CHF, the signs and symptoms of CHF, and how keep track of weight and sodium intake.   Heart Disease and Intimacy -Discus the effect sexual activity has on the heart, how changes occur during intimacy as we age, and safety during sexual activity.   Smoking Cessation / COPD -Discuss different methods to quit smoking, the health benefits of quitting smoking, and the definition of COPD.   Nutrition I: Fats -Discuss the types of  cholesterol, what cholesterol does to the heart, and how cholesterol levels can be controlled.   Nutrition II: Labels -Discuss the different components of food labels and how to read food label   Heart Parts/Heart Disease and PAD -Discuss the anatomy of the heart, the pathway of blood circulation through the heart, and these are affected by heart disease.   Stress I: Signs and Symptoms -Discuss the causes of stress, how stress may lead to anxiety and depression, and ways to limit stress.   Stress II: Relaxation -Discuss different types of relaxation techniques to limit stress.   Warning Signs of Stroke / TIA -Discuss definition of a stroke, what the signs and symptoms are of a stroke, and how to identify when someone is having stroke.   Knowledge Questionnaire Score:  Knowledge Questionnaire Score - 03/16/24 0936       Knowledge Questionnaire Score   Pre Score 21/24             Core Components/Risk Factors/Patient Goals at Admission:  Personal Goals and Risk Factors at Admission - 03/16/24 0936       Core Components/Risk Factors/Patient Goals on Admission    Weight Management Yes;Weight Loss;Weight Maintenance    Intervention Weight Management: Develop a combined nutrition and exercise program designed to reach desired caloric intake, while maintaining appropriate intake of nutrient and fiber, sodium and fats, and appropriate energy expenditure required for the weight goal.;Weight Management: Provide education and appropriate resources to help participant work on and attain dietary goals.;Weight Management/Obesity: Establish reasonable short term and long term weight goals.    Admit Weight 185 lb 14.4 oz (84.3 kg)    Goal Weight: Short Term 180 lb (81.6 kg)    Goal Weight: Long Term 180 lb (81.6 kg)    Expected Outcomes Short Term: Continue to assess and modify interventions until short term weight is achieved;Long Term: Adherence to nutrition and physical  activity/exercise program aimed toward attainment of established weight goal;Weight Loss: Understanding of general recommendations for a balanced deficit meal plan, which promotes 1-2 lb weight loss per week and includes a negative energy balance of [phone] kcal/d;Understanding recommendations for meals to include 15-35% energy as protein, 25-35% energy from fat, 35-60% energy from carbohydrates, less than 200mg  of dietary cholesterol, 20-35 gm of total fiber daily;Understanding of distribution of calorie intake throughout the day with the consumption of 4-5 meals/snacks;Weight Maintenance: Understanding of the daily nutrition guidelines, which includes 25-35% calories from fat, 7% or less cal from saturated fats, less than 200mg  cholesterol, less than 1.5gm of sodium, & 5 or more servings of fruits and vegetables daily    Diabetes Yes    Intervention Provide education about signs/symptoms and action to take for hypo/hyperglycemia.;Provide education about proper nutrition, including hydration, and aerobic/resistive exercise  prescription along with prescribed medications to achieve blood glucose in normal ranges: Fasting glucose 65-99 mg/dL    Expected Outcomes Short Term: Participant verbalizes understanding of the signs/symptoms and immediate care of hyper/hypoglycemia, proper foot care and importance of medication, aerobic/resistive exercise and nutrition plan for blood glucose control.;Long Term: Attainment of HbA1C < 7%.    Hypertension Yes    Intervention Provide education on lifestyle modifcations including regular physical activity/exercise, weight management, moderate sodium restriction and increased consumption of fresh fruit, vegetables, and low fat dairy, alcohol moderation, and smoking cessation.;Monitor prescription use compliance.    Expected Outcomes Short Term: Continued assessment and intervention until BP is < 140/60mm HG in hypertensive participants. < 130/49mm HG in hypertensive  participants with diabetes, heart failure or chronic kidney disease.;Long Term: Maintenance of blood pressure at goal levels.    Lipids Yes    Intervention Provide education and support for participant on nutrition & aerobic/resistive exercise along with prescribed medications to achieve LDL 70mg , HDL >40mg .    Expected Outcomes Short Term: Participant states understanding of desired cholesterol values and is compliant with medications prescribed. Participant is following exercise prescription and nutrition guidelines.;Long Term: Cholesterol controlled with medications as prescribed, with individualized exercise RX and with personalized nutrition plan. Value goals: LDL < 70mg , HDL > 40 mg.             Core Components/Risk Factors/Patient Goals Review:    Core Components/Risk Factors/Patient Goals at Discharge (Final Review):    ITP Comments:  ITP Comments     Row Name 03/11/24 0835 03/16/24 0930         ITP Comments Completed virtual orientation today.  EP evaluation is scheduled for Tuesday 03/18/24 at 830 .  Documentation for diagnosis can be found in CE for [facility] 01/31/24 . Patient attend orientation today.  Patient is attending Cardiac Rehabilitation Program.  Documentation for diagnosis can be found in media and CE for [facility] encounter date 01/31/24.  Reviewed medical chart, RPE/RPD, gym safety, and program guidelines.  Patient was fitted to equipment they will be using during rehab.  Patient is scheduled to start exercise on Wednesday 03/17/24 at 1100.   Initial ITP created and sent for review and signature by Dr. Armida Lander, Medical Director for Cardiac Rehabilitation Program.               Comments: Initial ITP

## 2024-03-17 ENCOUNTER — Encounter (HOSPITAL_COMMUNITY)
Admission: RE | Admit: 2024-03-17 | Discharge: 2024-03-17 | Disposition: A | Discharge status: Home / Self Care | Source: Ambulatory Visit

## 2024-03-17 DIAGNOSIS — I2111 ST elevation (STEMI) myocardial infarction involving right coronary artery: Secondary | ICD-10-CM | POA: Diagnosis not present

## 2024-03-17 DIAGNOSIS — Z951 Presence of aortocoronary bypass graft: Secondary | ICD-10-CM

## 2024-03-17 DIAGNOSIS — Z48812 Encounter for surgical aftercare following surgery on the circulatory system: Secondary | ICD-10-CM | POA: Diagnosis not present

## 2024-03-17 LAB — GLUCOSE, CAPILLARY
Glucose-Capillary: 209 mg/dL — ABNORMAL HIGH (ref 70–99)
Glucose-Capillary: 153 mg/dL — ABNORMAL HIGH (ref 70–99)

## 2024-03-17 NOTE — Progress Notes (Signed)
 Daily Session Note  Patient Details  Name: Aaron Smith MRN: [id number] Date of Birth: 07/06/1961 Referring Provider:   Flowsheet Row CARDIAC REHAB PHASE II ORIENTATION from 03/16/2024 in [facility] CARDIAC REHABILITATION  Referring Provider Anastacio Balm MD  [[place]]       Encounter Date: 03/17/2024  Check In:  Session Check In - 03/17/24 1030       Check-In   Supervising physician immediately available to respond to emergencies See telemetry face sheet for immediately available MD    Location [facility]    Staff Present Clotilda Danish, BS, Exercise Physiologist;Jessica Zoila Hines, MA, RCEP, CCRP, CCET;Hillary [employer], RN;Brittany Ashley, BSN, RN, [employer]    Virtual Visit No    Medication changes reported     No    Fall or balance concerns reported    No    Tobacco Cessation No Change    Warm-up and Cool-down Not performed (comment)    Resistance Training Performed Yes    VAD Patient? No    PAD/SET Patient? No      Pain Assessment   Currently in Pain? No/denies    Multiple Pain Sites No             Capillary Blood Glucose: No results found for this or any previous visit (from the past 24 hours).    Social History   Tobacco Use  Smoking Status Never  Smokeless Tobacco Never    Goals Met:  Independence with exercise equipment Exercise tolerated well No report of concerns or symptoms today Strength training completed today  Goals Unmet:  Not Applicable  Comments: First full day of exercise!  Patient was oriented to gym and equipment including functions, settings, policies, and procedures.  Patient's individual exercise prescription and treatment plan were reviewed.  All starting workloads were established based on the results of the 6 minute walk test done at initial orientation visit.  The plan for exercise progression was also introduced and progression will be customized based on patient's performance and goals.

## 2024-03-19 ENCOUNTER — Encounter (HOSPITAL_COMMUNITY)
Admission: RE | Admit: 2024-03-19 | Discharge: 2024-03-19 | Disposition: A | Discharge status: Home / Self Care | Source: Ambulatory Visit

## 2024-03-19 DIAGNOSIS — I2111 ST elevation (STEMI) myocardial infarction involving right coronary artery: Secondary | ICD-10-CM | POA: Diagnosis not present

## 2024-03-19 DIAGNOSIS — Z951 Presence of aortocoronary bypass graft: Secondary | ICD-10-CM

## 2024-03-19 DIAGNOSIS — Z48812 Encounter for surgical aftercare following surgery on the circulatory system: Secondary | ICD-10-CM | POA: Diagnosis not present

## 2024-03-19 LAB — GLUCOSE, CAPILLARY: Glucose-Capillary: 146 mg/dL — ABNORMAL HIGH (ref 70–99)

## 2024-03-19 NOTE — Progress Notes (Signed)
 Daily Session Note  Patient Details  Name: Aaron Smith MRN: [id number] Date of Birth: 1961/05/23 Referring Provider:   Flowsheet Row CARDIAC REHAB PHASE II ORIENTATION from 03/16/2024 in [facility] CARDIAC REHABILITATION  Referring Provider Anastacio Balm MD  [[place]]       Encounter Date: 03/19/2024  Check In:  Session Check In - 03/19/24 1100       Check-In   Supervising physician immediately available to respond to emergencies See telemetry face sheet for immediately available MD    Location [facility]    Staff Present Clotilda Danish, BS, Exercise Physiologist;Brittany Annette Barters, BSN, RN, WTA-C    Medication changes reported     No    Fall or balance concerns reported    No    Tobacco Cessation No Change    Warm-up and Cool-down Performed on first and last piece of equipment    Resistance Training Performed Yes    VAD Patient? No    PAD/SET Patient? No      Pain Assessment   Currently in Pain? No/denies    Multiple Pain Sites No             Capillary Blood Glucose: No results found for this or any previous visit (from the past 24 hours).    Social History   Tobacco Use  Smoking Status Never  Smokeless Tobacco Never    Goals Met:  Independence with exercise equipment Exercise tolerated well No report of concerns or symptoms today Strength training completed today  Goals Unmet:  Not Applicable  Comments: Pt able to follow exercise prescription today without complaint.  Will continue to monitor for progression.

## 2024-03-22 ENCOUNTER — Encounter (HOSPITAL_COMMUNITY)
Admission: RE | Admit: 2024-03-22 | Discharge: 2024-03-22 | Disposition: A | Discharge status: Home / Self Care | Source: Ambulatory Visit

## 2024-03-22 DIAGNOSIS — Z951 Presence of aortocoronary bypass graft: Secondary | ICD-10-CM

## 2024-03-22 DIAGNOSIS — Z48812 Encounter for surgical aftercare following surgery on the circulatory system: Secondary | ICD-10-CM | POA: Diagnosis not present

## 2024-03-22 DIAGNOSIS — I2111 ST elevation (STEMI) myocardial infarction involving right coronary artery: Secondary | ICD-10-CM

## 2024-03-22 NOTE — Progress Notes (Signed)
 Daily Session Note  Patient Details  Name: CLIFF ROTHFELD MRN: [id number] Date of Birth: [birth year] Referring Provider:   Flowsheet Row CARDIAC REHAB PHASE II ORIENTATION from 03/16/2024 in [facility] CARDIAC REHABILITATION  Referring Provider Anastacio Balm MD  [[place]]       Encounter Date: 03/22/2024  Check In:  Session Check In - 03/22/24 1045       Check-In   Supervising physician immediately available to respond to emergencies See telemetry face sheet for immediately available MD    Location [facility]    Staff Present Jerrol Morelle, BSN, RN, Tisha Forget, RN, BSN;Jessica Hawkins, MA, RCEP, CCRP, CCET    Virtual Visit No    Medication changes reported     No    Fall or balance concerns reported    No    Warm-up and Cool-down Performed on first and last piece of equipment    Resistance Training Performed Yes    VAD Patient? No    PAD/SET Patient? No      Pain Assessment   Currently in Pain? No/denies    Multiple Pain Sites No             Capillary Blood Glucose: No results found for this or any previous visit (from the past 24 hours).    Social History   Tobacco Use  Smoking Status Never  Smokeless Tobacco Never    Goals Met:  Proper associated with RPD/PD & O2 Sat Independence with exercise equipment Using PLB without cueing & demonstrates good technique Exercise tolerated well No report of concerns or symptoms today Strength training completed today  Goals Unmet:  Not Applicable  Comments: Pt able to follow exercise prescription today without complaint.  Will continue to monitor for progression.

## 2024-03-24 ENCOUNTER — Encounter (HOSPITAL_COMMUNITY)
Admission: RE | Admit: 2024-03-24 | Discharge: 2024-03-24 | Disposition: A | Discharge status: Home / Self Care | Source: Ambulatory Visit

## 2024-03-24 DIAGNOSIS — Z951 Presence of aortocoronary bypass graft: Secondary | ICD-10-CM

## 2024-03-24 DIAGNOSIS — I2111 ST elevation (STEMI) myocardial infarction involving right coronary artery: Secondary | ICD-10-CM

## 2024-03-24 DIAGNOSIS — Z48812 Encounter for surgical aftercare following surgery on the circulatory system: Secondary | ICD-10-CM | POA: Diagnosis not present

## 2024-03-24 NOTE — Progress Notes (Signed)
 Daily Session Note  Patient Details  Name: HENDRYX SNIDOW MRN: [id number] Date of Birth: [birth year] Referring Provider:   Flowsheet Row CARDIAC REHAB PHASE II ORIENTATION from 03/16/2024 in [facility] CARDIAC REHABILITATION  Referring Provider Anastacio Balm MD  [[place]]       Encounter Date: 03/24/2024  Check In:  Session Check In - 03/24/24 1020       Check-In   Supervising physician immediately available to respond to emergencies See telemetry face sheet for immediately available MD    Location [facility]    Staff Present Ronna Coho BSN, RN;Heather Toy Freund, BS, Exercise Physiologist;Jessica [place], MA, RCEP, CCRP, CCET    Virtual Visit No    Medication changes reported     No    Fall or balance concerns reported    No    Tobacco Cessation No Change    Warm-up and Cool-down Performed on first and last piece of equipment    Resistance Training Performed Yes    VAD Patient? No    PAD/SET Patient? No      Pain Assessment   Currently in Pain? No/denies    Multiple Pain Sites No             Capillary Blood Glucose: No results found for this or any previous visit (from the past 24 hours).    Social History   Tobacco Use  Smoking Status Never  Smokeless Tobacco Never    Goals Met:  Independence with exercise equipment Exercise tolerated well No report of concerns or symptoms today Strength training completed today  Goals Unmet:  Not Applicable  Comments: Aaron AasAaron AasPt able to follow exercise prescription today without complaint.  Will continue to monitor for progression.

## 2024-03-25 DIAGNOSIS — E119 Type 2 diabetes mellitus without complications: Secondary | ICD-10-CM | POA: Diagnosis not present

## 2024-03-29 ENCOUNTER — Encounter (HOSPITAL_COMMUNITY)
Admission: RE | Admit: 2024-03-29 | Discharge: 2024-03-29 | Disposition: A | Discharge status: Home / Self Care | Source: Ambulatory Visit

## 2024-03-29 DIAGNOSIS — Z48812 Encounter for surgical aftercare following surgery on the circulatory system: Secondary | ICD-10-CM | POA: Diagnosis not present

## 2024-03-29 DIAGNOSIS — I2111 ST elevation (STEMI) myocardial infarction involving right coronary artery: Secondary | ICD-10-CM

## 2024-03-29 DIAGNOSIS — Z951 Presence of aortocoronary bypass graft: Secondary | ICD-10-CM

## 2024-03-29 NOTE — Progress Notes (Signed)
 Daily Session Note  Patient Details  Name: Aaron Smith MRN: [id number] Date of Birth: 1961-04-11 Referring Provider:   Flowsheet Row CARDIAC REHAB PHASE II ORIENTATION from 03/16/2024 in [facility] CARDIAC REHABILITATION  Referring Provider Anastacio Balm MD  [[place]]       Encounter Date: 03/29/2024  Check In:  Session Check In - 03/29/24 1045       Check-In   Supervising physician immediately available to respond to emergencies See telemetry face sheet for immediately available MD    Location [facility]    Staff Present Clotilda Danish, BS, Exercise Physiologist;Kiante Ciavarella Annette Barters, BSN, RN, Aggie Horton, MA, RCEP, CCRP, CCET    Virtual Visit No    Medication changes reported     No    Fall or balance concerns reported    No    Tobacco Cessation No Change    Warm-up and Cool-down Performed on first and last piece of equipment    Resistance Training Performed Yes    VAD Patient? No    PAD/SET Patient? No      Pain Assessment   Currently in Pain? No/denies             Capillary Blood Glucose: No results found for this or any previous visit (from the past 24 hours).    Social History   Tobacco Use  Smoking Status Never  Smokeless Tobacco Never    Goals Met:  Independence with exercise equipment Exercise tolerated well No report of concerns or symptoms today Strength training completed today  Goals Unmet:  Not Applicable  Comments: Pt able to follow exercise prescription today without complaint.  Will continue to monitor for progression.

## 2024-03-31 ENCOUNTER — Encounter (HOSPITAL_COMMUNITY): Payer: Self-pay | Admitting: *Deleted

## 2024-03-31 ENCOUNTER — Encounter (HOSPITAL_COMMUNITY)

## 2024-03-31 DIAGNOSIS — I2111 ST elevation (STEMI) myocardial infarction involving right coronary artery: Secondary | ICD-10-CM

## 2024-03-31 DIAGNOSIS — Z951 Presence of aortocoronary bypass graft: Secondary | ICD-10-CM

## 2024-03-31 NOTE — Progress Notes (Signed)
 Cardiac Individual Treatment Plan  Patient Details  Name: Aaron Smith MRN: [id number] Date of Birth: [birth year] Referring Provider:   Flowsheet Row CARDIAC REHAB PHASE II ORIENTATION from 03/16/2024 in [facility] CARDIAC REHABILITATION  Referring Provider Anastacio Balm MD  [[place]]       Initial Encounter Date:  Flowsheet Row CARDIAC REHAB PHASE II ORIENTATION from 03/16/2024 in [place] CARDIAC REHABILITATION  Date 03/16/24       Visit Diagnosis: ST elevation myocardial infarction involving right coronary artery (HCC)  S/P CABG x 5  Patient's Home Medications on Admission:  Current Outpatient Medications:    ASPIRIN LOW DOSE 81 MG tablet, Take 81 mg by mouth every morning., Disp: , Rfl:    Blood Glucose Monitoring Suppl (ACCU-CHEK GUIDE ME) w/Device KIT, 1 each by Does not apply route., Disp: , Rfl:    clindamycin (CLEOCIN) 300 MG capsule, , Disp: , Rfl:    clopidogrel (PLAVIX) 75 MG tablet, Take 75 mg by mouth daily., Disp: , Rfl:    Continuous Glucose Sensor (DEXCOM G7 SENSOR) MISC, 1 each by Does not apply route., Disp: , Rfl:    docusate sodium (COLACE) 100 MG capsule, Take 100 mg by mouth 2 (two) times daily as needed. (Patient not taking: Reported on 03/16/2024), Disp: , Rfl:    ferrous sulfate 325 (65 FE) MG tablet, Take 325 mg by mouth., Disp: , Rfl:    insulin aspart (NOVOLOG) 100 UNIT/ML FlexPen, Inject 4 Units into the skin., Disp: , Rfl:    Insulin Glargine Solostar (LANTUS) 100 UNIT/ML Solostar Pen, Inject 24 Units into the skin., Disp: , Rfl:    losartan (COZAAR) 25 MG tablet, Take 25 mg by mouth every morning., Disp: , Rfl:    Melatonin 5 MG CHEW, Chew 5 mg by mouth daily as needed (for sleep)., Disp: , Rfl:    metFORMIN  (GLUCOPHAGE ) 1000 MG tablet, Take 1 tablet (1,000 mg total) by mouth 2 (two) times daily with a meal., Disp: 180 tablet, Rfl: 0   metolazone  (ZAROXOLYN ) 5 MG tablet, Take 1 tablet (5 mg total) by mouth daily., Disp: 90 tablet, Rfl: 3    Multiple Vitamin (MULTIVITAMIN) capsule, Take by mouth., Disp: , Rfl:    naproxen (NAPROSYN) 250 MG tablet, Take by mouth 2 (two) times daily with a meal., Disp: , Rfl:    rosuvastatin  (CRESTOR ) 40 MG tablet, Take 1 tablet (40 mg total) by mouth daily., Disp: 90 tablet, Rfl: 0   sildenafil  (REVATIO ) 20 MG tablet, Take 1-3 tabs po prn x1 for sex, Disp: 90 tablet, Rfl: 1   traZODone  (DESYREL ) 150 MG tablet, Take 1 tablet (150 mg total) by mouth at bedtime., Disp: 15 tablet, Rfl: 2  Past Medical History: Past Medical History:  Diagnosis Date   Diabetes mellitus without complication (HCC)    Hypertension     Tobacco Use: Social History   Tobacco Use  Smoking Status Never  Smokeless Tobacco Never    Labs: Review Flowsheet  More data exists      Latest Ref Rng & Units 12/05/2020 08/21/2021 12/26/2021 02/17/2023 08/27/2023  Labs for ITP Cardiac and Pulmonary Rehab  Cholestrol 100 - 199 mg/dL 132  440  - 102  725   LDL (calc) 0 - 99 mg/dL 366  83  - 93  440   HDL-C >39 mg/dL 38  33  - 37  31   Trlycerides 0 - 149 mg/dL 347  425  - 956  387   Hemoglobin A1c  4.8 - 5.6 % 8.5  9.4  7.3  10.0  -    Capillary Blood Glucose: Lab Results  Component Value Date   GLUCAP 146 (H) 03/19/2024   GLUCAP 153 (H) 03/17/2024   GLUCAP 209 (H) 03/17/2024   GLUCAP 170 (H) 10/07/2018   GLUCAP 145 (H) 10/07/2018     Exercise Target Goals: Exercise Program Goal: Individual exercise prescription set using results from initial 6 min walk test and THRR while considering  patient's activity barriers and safety.   Exercise Prescription Goal: Starting with aerobic activity 30 plus minutes a day, 3 days per week for initial exercise prescription. Provide home exercise prescription and guidelines that participant acknowledges understanding prior to discharge.  Activity Barriers & Risk Stratification:  Activity Barriers & Cardiac Risk Stratification - 03/16/24 0932       Activity Barriers & Cardiac Risk  Stratification   Activity Barriers Incisional Pain;Deconditioning    Cardiac Risk Stratification High             6 Minute Walk:  6 Minute Walk     Row Name 03/16/24 0932         6 Minute Walk   Phase Initial     Distance 1345 feet     Walk Time 6 minutes     # of Rest Breaks 0     MPH 2.55     METS 3.34     RPE 9     Perceived Dyspnea  0     VO2 Peak 11.68     Symptoms No     Resting HR 73 bpm     Resting BP 116/58     Resting Oxygen Saturation  99 %     Exercise Oxygen Saturation  during 6 min walk 96 %     Max Ex. HR 86 bpm     Max Ex. BP 128/70     2 Minute Post BP 126/72              Oxygen Initial Assessment:   Oxygen Re-Evaluation:   Oxygen Discharge (Final Oxygen Re-Evaluation):   Initial Exercise Prescription:  Initial Exercise Prescription - 03/16/24 0900       Date of Initial Exercise RX and Referring Provider   Date 03/16/24    Referring Provider Anastacio Balm MD   [facility]     Oxygen   Maintain Oxygen Saturation 88% or higher      Treadmill   MPH 2.5    Grade 1    Minutes 15    METs 3.26      Elliptical   Level 1    Speed 50    Minutes 15    METs 3.3      REL-XR   Level 3    Speed 50    Minutes 15    METs 3.3      Prescription Details   Frequency (times per week) 3    Duration Progress to 30 minutes of continuous aerobic without signs/symptoms of physical distress      Intensity   THRR 40-80% of Max Heartrate 107-140    Ratings of Perceived Exertion 11-13    Perceived Dyspnea 0-4      Progression   Progression Continue to progress workloads to maintain intensity without signs/symptoms of physical distress.      Resistance Training   Training Prescription Yes    Weight 3 lb    Reps 10-15  Perform Capillary Blood Glucose checks as needed.  Exercise Prescription Changes:   Exercise Prescription Changes     Row Name 03/16/24 0900             Response to Exercise   Blood Pressure  (Admit) 116/58       Blood Pressure (Exercise) 128/70       Blood Pressure (Exit) 126/72       Heart Rate (Admit) 73 bpm       Heart Rate (Exercise) 86 bpm       Heart Rate (Exit) 73 bpm       Oxygen Saturation (Admit) 99 %       Oxygen Saturation (Exercise) 96 %       Rating of Perceived Exertion (Exercise) 9       Perceived Dyspnea (Exercise) 0       Symptoms none       Comments walk test results                Exercise Comments:   Exercise Comments     Row Name 03/17/24 1041           Exercise Comments First full day of exercise!  Patient was oriented to gym and equipment including functions, settings, policies, and procedures.  Patient's individual exercise prescription and treatment plan were reviewed.  All starting workloads were established based on the results of the 6 minute walk test done at initial orientation visit.  The plan for exercise progression was also introduced and progression will be customized based on patient's performance and goals.                Exercise Goals and Review:   Exercise Goals     Row Name 03/11/24 [phone]             Exercise Goals   Increase Physical Activity Yes       Intervention Provide advice, education, support and counseling about physical activity/exercise needs.;Develop an individualized exercise prescription for aerobic and resistive training based on initial evaluation findings, risk stratification, comorbidities and participant's personal goals.       Expected Outcomes Short Term: Attend rehab on a regular basis to increase amount of physical activity.;Long Term: Exercising regularly at least 3-5 days a week.;Long Term: Add in home exercise to make exercise part of routine and to increase amount of physical activity.       Increase Strength and Stamina Yes       Intervention Provide advice, education, support and counseling about physical activity/exercise needs.;Develop an individualized exercise prescription for  aerobic and resistive training based on initial evaluation findings, risk stratification, comorbidities and participant's personal goals.       Expected Outcomes Short Term: Increase workloads from initial exercise prescription for resistance, speed, and METs.;Short Term: Perform resistance training exercises routinely during rehab and add in resistance training at home;Long Term: Improve cardiorespiratory fitness, muscular endurance and strength as measured by increased METs and functional capacity ( )       Able to understand and use rate of perceived exertion (RPE) scale Yes       Intervention Provide education and explanation on how to use RPE scale       Expected Outcomes Short Term: Able to use RPE daily in rehab to express subjective intensity level;Long Term:  Able to use RPE to guide intensity level when exercising independently       Able to understand and use Dyspnea scale Yes  Intervention Provide education and explanation on how to use Dyspnea scale       Expected Outcomes Short Term: Able to use Dyspnea scale daily in rehab to express subjective sense of shortness of breath during exertion;Long Term: Able to use Dyspnea scale to guide intensity level when exercising independently       Knowledge and understanding of Target Heart Rate Range (THRR) Yes       Intervention Provide education and explanation of THRR including how the numbers were predicted and where they are located for reference       Expected Outcomes Short Term: Able to state/look up THRR;Short Term: Able to use daily as guideline for intensity in rehab;Long Term: Able to use THRR to govern intensity when exercising independently       Able to check pulse independently Yes       Intervention Provide education and demonstration on how to check pulse in carotid and radial arteries.;Review the importance of being able to check your own pulse for safety during independent exercise       Expected Outcomes Short Term: Able  to explain why pulse checking is important during independent exercise;Long Term: Able to check pulse independently and accurately       Understanding of Exercise Prescription Yes       Intervention Provide education, explanation, and written materials on patient's individual exercise prescription       Expected Outcomes Short Term: Able to explain program exercise prescription;Long Term: Able to explain home exercise prescription to exercise independently                Exercise Goals Re-Evaluation :  Exercise Goals Re-Evaluation     Row Name 03/17/24 1042             Exercise Goal Re-Evaluation   Exercise Goals Review Able to understand and use rate of perceived exertion (RPE) scale;Knowledge and understanding of Target Heart Rate Range (THRR)       Comments Reviewed RPE and dyspnea scale, THR and program prescription with pt today.  Pt voiced understanding and was given a copy of goals to take home.       Expected Outcomes Short: Use RPE daily to regulate intensity.  Long: Follow program prescription in THR.                 Discharge Exercise Prescription (Final Exercise Prescription Changes):  Exercise Prescription Changes - 03/16/24 0900       Response to Exercise   Blood Pressure (Admit) 116/58    Blood Pressure (Exercise) 128/70    Blood Pressure (Exit) 126/72    Heart Rate (Admit) 73 bpm    Heart Rate (Exercise) 86 bpm    Heart Rate (Exit) 73 bpm    Oxygen Saturation (Admit) 99 %    Oxygen Saturation (Exercise) 96 %    Rating of Perceived Exertion (Exercise) 9    Perceived Dyspnea (Exercise) 0    Symptoms none    Comments walk test results             Nutrition:  Target Goals: Understanding of nutrition guidelines, daily intake of sodium 1500mg , cholesterol 200mg , calories 30% from fat and 7% or less from saturated fats, daily to have 5 or more servings of fruits and vegetables.  Biometrics:  Pre Biometrics - 03/16/24 0935       Pre Biometrics    Height 5' 10.5" (1.791 m)    Weight 185 lb 14.4 oz (84.3 kg)  Waist Circumference 36 inches    Hip Circumference 39 inches    Waist to Hip Ratio 0.92 %    BMI (Calculated) 26.29    Grip Strength 29.3 kg    Single Leg Stand 30 seconds              Nutrition Therapy Plan and Nutrition Goals:  Nutrition Therapy & Goals - 03/11/24 0823       Intervention Plan   Intervention Prescribe, educate and counsel regarding individualized specific dietary modifications aiming towards targeted core components such as weight, hypertension, lipid management, diabetes, heart failure and other comorbidities.;Nutrition handout(s) given to patient.    Expected Outcomes Short Term Goal: Understand basic principles of dietary content, such as calories, fat, sodium, cholesterol and nutrients.;Long Term Goal: Adherence to prescribed nutrition plan.             Nutrition Assessments:  MEDIFICTS Score Key: >=70 Need to make dietary changes  40-70 Heart Healthy Diet <= 40 Therapeutic Level Cholesterol Diet  Flowsheet Row CARDIAC REHAB PHASE II ORIENTATION from 03/16/2024 in [facility] CARDIAC RE[facility]ABILITATION  Picture Your Plate Total Score on Admission 61      Picture Your Plate Scores: <45 Unhealthy dietary pattern with much room for improvement. 41-50 Dietary pattern unlikely to meet recommendations for good health and room for improvement. 51-60 More healthful dietary pattern, with some room for improvement.  >60 Healthy dietary pattern, although there may be some specific behaviors that could be improved.    Nutrition Goals Re-Evaluation:   Nutrition Goals Discharge (Final Nutrition Goals Re-Evaluation):   Psychosocial: Target Goals: Acknowledge presence or absence of significant depression and/or stress, maximize coping skills, provide positive support system. Participant is able to verbalize types and ability to use techniques and skills needed for reducing stress and  depression.  Initial Review & Psychosocial Screening:  Initial Psych Review & Screening - 03/11/24 0815       Initial Review   Current issues with Current Sleep Concerns;Current Stress Concerns    Source of Stress Concerns Unable to participate in former interests or hobbies;Unable to perform yard/household activities    Comments not a back sleeper and having a difficult time to sleep, restritctions to activity      Family Dynamics   Good Support System? Yes   wife     Barriers   Psychosocial barriers to participate in program The patient should benefit from training in stress management and relaxation.;Psychosocial barriers identified (see note)      Screening Interventions   Interventions Encouraged to exercise;To provide support and resources with identified psychosocial needs;Provide feedback about the scores to participant    Expected Outcomes Short Term goal: Utilizing psychosocial counselor, staff and physician to assist with identification of specific Stressors or current issues interfering with healing process. Setting desired goal for each stressor or current issue identified.;Long Term Goal: Stressors or current issues are controlled or eliminated.;Short Term goal: Identification and review with participant of any Quality of Life or Depression concerns found by scoring the questionnaire.;Long Term goal: The participant improves quality of Life and PHQ9 Scores as seen by post scores and/or verbalization of changes             Quality of Life Scores:  Quality of Life - 03/16/24 0935       Quality of Life   Select Quality of Life      Quality of Life Scores   Health/Function Pre 17.2 %    Socioeconomic Pre 27.75 %  Psych/Spiritual Pre 24 %    Family Pre 24 %    GLOBAL Pre 21.94 %            Scores of 19 and below usually indicate a poorer quality of life in these areas.  A difference of  2-3 points is a clinically meaningful difference.  A difference of 2-3  points in the total score of the Quality of Life Index has been associated with significant improvement in overall quality of life, self-image, physical symptoms, and general health in studies assessing change in quality of life.  PHQ-9: Review Flowsheet       03/16/2024  Depression screen PHQ 2/9  Decreased Interest 1  Down, Depressed, Hopeless 1  PHQ - 2 Score 2  Altered sleeping 3  Tired, decreased energy 2  Change in appetite 2  Feeling bad or failure about yourself  1  Trouble concentrating 1  Moving slowly or fidgety/restless 1  Suicidal thoughts 0  PHQ-9 Score 12  Difficult doing work/chores Somewhat difficult   Interpretation of Total Score  Total Score Depression Severity:  1-4 = Minimal depression, 5-9 = Mild depression, 10-14 = Moderate depression, 15-19 = Moderately severe depression, 20-27 = Severe depression   Psychosocial Evaluation and Intervention:  Psychosocial Evaluation - 03/11/24 0843       Psychosocial Evaluation & Interventions   Interventions Stress management education;Encouraged to exercise with the program and follow exercise prescription    Comments Sid is coming into cardiac rehab after STEMI and bypass surgery in March.  [place] his heart attack and presented at [place] then transported to [facility] in [place]. He is still having some incisional soreness since surgery, especially on right side.  Since surgery, he has stopped his insulin after having a few episodes of 40s-60s and talking with his doctor.  He is eager to get moving again and wants to build his strength back up.  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  He is also having a hard time sleeping since surgery.  He usually only gets a few hours a night.  He has been sleeping in bed and recliner on his  back but is usually a side sleeper.  He really wants to get back to sleeping.  He has tried some home remedies, but with occassional help and exhaustion to get some rest.  We talked about using a supported side sleeping position to not harm chest as he is transitioning his sleep position to try to get more rest.  He is looking forward to program and learning his new limitiations and how hard to push himself.  His wife has been his support through all this and she plans to attend orientation with him as well.  She is also on him about taking all of his medications and montioring his blood sugars.  He is now using a Dexcom and has found it very helpful in maintain his sugars.  He will only use his insulin on the rare occassions that his sugar is too high.    Expected Outcomes Short: Attend rehab to build up strength and stamina Long: Continue to cope with restrictions until lifted    Continue Psychosocial Services  Follow up required by staff  Psychosocial Re-Evaluation:  Psychosocial Re-Evaluation     Row Name 03/19/24 1210             Psychosocial Re-Evaluation   Current issues with Current Sleep Concerns       Comments Sid is off to a good start.  Continues to have issues with sleeping. He has been trying out melatonin.  We talked about some different options to try and sent him some sleep information from [employer] and on sleep hygiene to review. We also discussed trying supported side sleeping.       Expected Outcomes Short: Review sleep info Long: Improved sleep quality.       Interventions Encouraged to attend Cardiac Rehabilitation for the exercise;Stress management education;Relaxation education       Continue Psychosocial Services  Follow up required by staff                Psychosocial Discharge (Final Psychosocial Re-Evaluation):  Psychosocial Re-Evaluation - 03/19/24 1210       Psychosocial Re-Evaluation   Current issues with Current Sleep Concerns    Comments  Sid is off to a good start.  Continues to have issues with sleeping. He has been trying out melatonin.  We talked about some different options to try and sent him some sleep information from [employer] and on sleep hygiene to review. We also discussed trying supported side sleeping.    Expected Outcomes Short: Review sleep info Long: Improved sleep quality.    Interventions Encouraged to attend Cardiac Rehabilitation for the exercise;Stress management education;Relaxation education    Continue Psychosocial Services  Follow up required by staff             Vocational Rehabilitation: Provide vocational rehab assistance to qualifying candidates.   Vocational Rehab Evaluation & Intervention:  Vocational Rehab - 03/11/24 0981       Initial Vocational Rehab Evaluation & Intervention   Assessment shows need for Vocational Rehabilitation No   retired            Education: Education Goals: Education classes will be provided on a weekly basis, covering required topics. Participant will state understanding/return demonstration of topics presented.  Learning Barriers/Preferences:  Learning Barriers/Preferences - 03/11/24 0810       Learning Barriers/Preferences   Learning Barriers Sight   contacts and reading glasses   Learning Preferences None             Education Topics: Hypertension, Hypertension Reduction -Define heart disease and high blood pressure. Discus how high blood pressure affects the body and ways to reduce high blood pressure.   Exercise and Your Heart -Discuss why it is important to exercise, the FITT principles of exercise, normal and abnormal responses to exercise, and how to exercise safely.   Angina -Discuss definition of angina, causes of angina, treatment of angina, and how to decrease risk of having angina.   Cardiac Medications -Review what the following cardiac medications are used for, how they affect the body, and side effects that may occur when  taking the medications.  Medications include Aspirin, Beta blockers, calcium  channel blockers, ACE Inhibitors, angiotensin receptor blockers, diuretics, digoxin, and antihyperlipidemics.   Congestive Heart Failure -Discuss the definition of CHF, how to live with CHF, the signs and symptoms of CHF, and how keep track of weight and sodium intake.   Heart Disease and Intimacy -Discus the effect sexual activity has on the heart, how changes occur during intimacy as we age, and safety during sexual activity.   Smoking  Cessation / COPD -Discuss different methods to quit smoking, the health benefits of quitting smoking, and the definition of COPD.   Nutrition I: Fats -Discuss the types of cholesterol, what cholesterol does to the heart, and how cholesterol levels can be controlled. Flowsheet Row CARDIAC REHAB PHASE II EXERCISE from 03/24/2024 in [place] CARDIAC REHABILITATION  Date 03/17/24  Educator jh  Instruction Review Code 1- Verbalizes Understanding       Nutrition II: Labels -Discuss the different components of food labels and how to read food label   Heart Parts/Heart Disease and PAD -Discuss the anatomy of the heart, the pathway of blood circulation through the heart, and these are affected by heart disease.   Stress I: Signs and Symptoms -Discuss the causes of stress, how stress may lead to anxiety and depression, and ways to limit stress. Flowsheet Row CARDIAC REHAB PHASE II EXERCISE from 03/24/2024 in [place] CARDIAC REHABILITATION  Date 03/24/24  Educator HB  Instruction Review Code 1- Verbalizes Understanding       Stress II: Relaxation -Discuss different types of relaxation techniques to limit stress.   Warning Signs of Stroke / TIA -Discuss definition of a stroke, what the signs and symptoms are of a stroke, and how to identify when someone is having stroke.   Knowledge Questionnaire Score:  Knowledge Questionnaire Score - 03/16/24 0936        Knowledge Questionnaire Score   Pre Score 21/24             Core Components/Risk Factors/Patient Goals at Admission:  Personal Goals and Risk Factors at Admission - 03/16/24 0936       Core Components/Risk Factors/Patient Goals on Admission    Weight Management Yes;Weight Loss;Weight Maintenance    Intervention Weight Management: Develop a combined nutrition and exercise program designed to reach desired caloric intake, while maintaining appropriate intake of nutrient and fiber, sodium and fats, and appropriate energy expenditure required for the weight goal.;Weight Management: Provide education and appropriate resources to help participant work on and attain dietary goals.;Weight Management/Obesity: Establish reasonable short term and long term weight goals.    Admit Weight 185 lb 14.4 oz (84.3 kg)    Goal Weight: Short Term 180 lb (81.6 kg)    Goal Weight: Long Term 180 lb (81.6 kg)    Expected Outcomes Short Term: Continue to assess and modify interventions until short term weight is achieved;Long Term: Adherence to nutrition and physical activity/exercise program aimed toward attainment of established weight goal;Weight Loss: Understanding of general recommendations for a balanced deficit meal plan, which promotes 1-2 lb weight loss per week and includes a negative energy balance of [phone] kcal/d;Understanding recommendations for meals to include 15-35% energy as protein, 25-35% energy from fat, 35-60% energy from carbohydrates, less than 200mg  of dietary cholesterol, 20-35 gm of total fiber daily;Understanding of distribution of calorie intake throughout the day with the consumption of 4-5 meals/snacks;Weight Maintenance: Understanding of the daily nutrition guidelines, which includes 25-35% calories from fat, 7% or less cal from saturated fats, less than 200mg  cholesterol, less than 1.5gm of sodium, & 5 or more servings of fruits and vegetables daily    Diabetes Yes    Intervention  Provide education about signs/symptoms and action to take for hypo/hyperglycemia.;Provide education about proper nutrition, including hydration, and aerobic/resistive exercise prescription along with prescribed medications to achieve blood glucose in normal ranges: Fasting glucose 65-99 mg/dL    Expected Outcomes Short Term: Participant verbalizes understanding of the signs/symptoms and immediate care of  hyper/hypoglycemia, proper foot care and importance of medication, aerobic/resistive exercise and nutrition plan for blood glucose control.;Long Term: Attainment of HbA1C < 7%.    Hypertension Yes    Intervention Provide education on lifestyle modifcations including regular physical activity/exercise, weight management, moderate sodium restriction and increased consumption of fresh fruit, vegetables, and low fat dairy, alcohol moderation, and smoking cessation.;Monitor prescription use compliance.    Expected Outcomes Short Term: Continued assessment and intervention until BP is < 140/68mm HG in hypertensive participants. < 130/27mm HG in hypertensive participants with diabetes, heart failure or chronic kidney disease.;Long Term: Maintenance of blood pressure at goal levels.    Lipids Yes    Intervention Provide education and support for participant on nutrition & aerobic/resistive exercise along with prescribed medications to achieve LDL 70mg , HDL >40mg .    Expected Outcomes Short Term: Participant states understanding of desired cholesterol values and is compliant with medications prescribed. Participant is following exercise prescription and nutrition guidelines.;Long Term: Cholesterol controlled with medications as prescribed, with individualized exercise RX and with personalized nutrition plan. Value goals: LDL < 70mg , HDL > 40 mg.             Core Components/Risk Factors/Patient Goals Review:    Core Components/Risk Factors/Patient Goals at Discharge (Final Review):    ITP Comments:  ITP  Comments     Row Name 03/11/24 0835 03/16/24 0930 03/17/24 1041 03/31/24 0955     ITP Comments Completed virtual orientation today.  EP evaluation is scheduled for Tuesday 03/18/24 at 830 .  Documentation for diagnosis can be found in CE for [facility] 01/31/24 . Patient attend orientation today.  Patient is attending Cardiac Rehabilitation Program.  Documentation for diagnosis can be found in media and CE for [facility] encounter date 01/31/24.  Reviewed medical chart, RPE/RPD, gym safety, and program guidelines.  Patient was fitted to equipment they will be using during rehab.  Patient is scheduled to start exercise on Wednesday 03/17/24 at 1100.   Initial ITP created and sent for review and signature by Dr. Armida Lander, Medical Director for Cardiac Rehabilitation Program. First full day of exercise!  Patient was oriented to gym and equipment including functions, settings, policies, and procedures.  Patient's individual exercise prescription and treatment plan were reviewed.  All starting workloads were established based on the results of the 6 minute walk test done at initial orientation visit.  The plan for exercise progression was also introduced and progression will be customized based on patient's performance and goals. 30 day review completed. ITP sent to Dr. Armida Lander, Medical Director of Cardiac Rehab. Continue with ITP unless changes are made by physician.  Newer to program             Comments: 30 day review

## 2024-04-02 ENCOUNTER — Encounter (HOSPITAL_COMMUNITY)

## 2024-04-07 ENCOUNTER — Encounter (HOSPITAL_COMMUNITY)
Admission: RE | Admit: 2024-04-07 | Discharge: 2024-04-07 | Disposition: A | Discharge status: Home / Self Care | Source: Ambulatory Visit

## 2024-04-07 DIAGNOSIS — Z951 Presence of aortocoronary bypass graft: Secondary | ICD-10-CM

## 2024-04-07 DIAGNOSIS — I2111 ST elevation (STEMI) myocardial infarction involving right coronary artery: Secondary | ICD-10-CM

## 2024-04-07 DIAGNOSIS — Z48812 Encounter for surgical aftercare following surgery on the circulatory system: Secondary | ICD-10-CM | POA: Diagnosis not present

## 2024-04-07 NOTE — Progress Notes (Signed)
 Daily Session Note  Patient Details  Name: WASH NIENHAUS MRN: [id number] Date of Birth: [birth year] Referring Provider:   Flowsheet Row CARDIAC REHAB PHASE II ORIENTATION from 03/16/2024 in [facility] CARDIAC REHABILITATION  Referring Provider Anastacio Balm MD  [[place]]       Encounter Date: 04/07/2024  Check In:  Session Check In - 04/07/24 1030       Check-In   Supervising physician immediately available to respond to emergencies See telemetry face sheet for immediately available MD    Location [facility]    Staff Present Rita Cherry, MA, RCEP, CCRP, CCET;Audreyanna Butkiewicz Toy Freund, [place], Exercise Physiologist    Virtual Visit No    Medication changes reported     No    Fall or balance concerns reported    No    Tobacco Cessation No Change    Warm-up and Cool-down Performed on first and last piece of equipment    Resistance Training Performed Yes    VAD Patient? No    PAD/SET Patient? No      Pain Assessment   Currently in Pain? No/denies    Multiple Pain Sites No             Capillary Blood Glucose: No results found for this or any previous visit (from the past 24 hours).    Social History   Tobacco Use  Smoking Status Never  Smokeless Tobacco Never    Goals Met:  Independence with exercise equipment Exercise tolerated well No report of concerns or symptoms today Strength training completed today  Goals Unmet:  Not Applicable  Comments: Pt able to follow exercise prescription today without complaint.  Will continue to monitor for progression.

## 2024-04-07 NOTE — Progress Notes (Signed)
 Cardiac:  Reviewed home exercise with pt today.  Pt plans to walk at home and use hand weights too for exercise.  Reviewed THR, pulse, RPE, sign and symptoms, pulse oximetery and when to call 911 or MD.  Also discussed weather considerations and indoor options.  Pt voiced understanding.

## 2024-04-09 ENCOUNTER — Encounter (HOSPITAL_COMMUNITY)
Admission: RE | Admit: 2024-04-09 | Discharge: 2024-04-09 | Disposition: A | Discharge status: Home / Self Care | Source: Ambulatory Visit

## 2024-04-09 DIAGNOSIS — Z951 Presence of aortocoronary bypass graft: Secondary | ICD-10-CM

## 2024-04-09 DIAGNOSIS — Z48812 Encounter for surgical aftercare following surgery on the circulatory system: Secondary | ICD-10-CM | POA: Diagnosis not present

## 2024-04-09 DIAGNOSIS — I2111 ST elevation (STEMI) myocardial infarction involving right coronary artery: Secondary | ICD-10-CM | POA: Diagnosis not present

## 2024-04-09 NOTE — Progress Notes (Signed)
 Daily Session Note  Patient Details  Name: Aaron Smith MRN: [id number] Date of Birth: 1961-07-08 Referring Provider:   Flowsheet Row CARDIAC REHAB PHASE II ORIENTATION from 03/16/2024 in [facility] CARDIAC REHABILITATION  Referring Provider Anastacio Balm MD  [[place]]       Encounter Date: 04/09/2024  Check In:  Session Check In - 04/09/24 1118       Check-In   Supervising physician immediately available to respond to emergencies See telemetry face sheet for immediately available MD    Location [facility]    Staff Present Rita Cherry, MA, RCEP, CCRP, Amador Bad, RN, BSN    Virtual Visit No    Medication changes reported     No    Fall or balance concerns reported    No    Warm-up and Cool-down Performed on first and last piece of equipment    Resistance Training Performed Yes    VAD Patient? No    PAD/SET Patient? No      Pain Assessment   Currently in Pain? No/denies             Capillary Blood Glucose: No results found for this or any previous visit (from the past 24 hours).    Social History   Tobacco Use  Smoking Status Never  Smokeless Tobacco Never    Goals Met:  Independence with exercise equipment Exercise tolerated well No report of concerns or symptoms today Strength training completed today  Goals Unmet:  Not Applicable  Comments: Pt able to follow exercise prescription today without complaint.  Will continue to monitor for progression.

## 2024-04-12 ENCOUNTER — Encounter (HOSPITAL_COMMUNITY)
Admission: RE | Admit: 2024-04-12 | Discharge: 2024-04-12 | Disposition: A | Discharge status: Home / Self Care | Source: Ambulatory Visit

## 2024-04-12 DIAGNOSIS — Z951 Presence of aortocoronary bypass graft: Secondary | ICD-10-CM | POA: Insufficient documentation

## 2024-04-12 DIAGNOSIS — I2111 ST elevation (STEMI) myocardial infarction involving right coronary artery: Secondary | ICD-10-CM | POA: Insufficient documentation

## 2024-04-12 NOTE — Progress Notes (Signed)
 Daily Session Note  Patient Details  Name: Aaron Smith MRN: [id number] Date of Birth: [birth year] Referring Provider:   Flowsheet Row CARDIAC REHAB PHASE II ORIENTATION from 03/16/2024 in [facility] CARDIAC REHABILITATION  Referring Provider Anastacio Balm MD  [[place]]       Encounter Date: 04/12/2024  Check In:  Session Check In - 04/12/24 1057       Check-In   Supervising physician immediately available to respond to emergencies See telemetry face sheet for immediately available MD    Location [facility]    Staff Present Jerrol Morelle, BSN, RN, WTA-C;Heather Toy Freund, BS, Exercise Physiologist    Virtual Visit No    Medication changes reported     No    Fall or balance concerns reported    No    Tobacco Cessation No Change    Warm-up and Cool-down Performed on first and last piece of equipment    Resistance Training Performed Yes    VAD Patient? No      Pain Assessment   Currently in Pain? No/denies             Capillary Blood Glucose: No results found for this or any previous visit (from the past 24 hours).    Social History   Tobacco Use  Smoking Status Never  Smokeless Tobacco Never    Goals Met:  Independence with exercise equipment Exercise tolerated well No report of concerns or symptoms today Strength training completed today  Goals Unmet:  Not Applicable  Comments: Pt able to follow exercise prescription today without complaint.  Will continue to monitor for progression.

## 2024-04-14 ENCOUNTER — Encounter (HOSPITAL_COMMUNITY)
Admission: RE | Admit: 2024-04-14 | Discharge: 2024-04-14 | Disposition: A | Discharge status: Home / Self Care | Source: Ambulatory Visit

## 2024-04-14 DIAGNOSIS — I2111 ST elevation (STEMI) myocardial infarction involving right coronary artery: Secondary | ICD-10-CM

## 2024-04-14 DIAGNOSIS — Z951 Presence of aortocoronary bypass graft: Secondary | ICD-10-CM

## 2024-04-14 NOTE — Progress Notes (Signed)
 Daily Session Note  Patient Details  Name: Aaron Smith MRN: [id number] Date of Birth: 02-19-1961 Referring Provider:   Flowsheet Row CARDIAC REHAB PHASE II ORIENTATION from 03/16/2024 in [facility] CARDIAC REHABILITATION  Referring Provider Anastacio Balm MD  [[place]]       Encounter Date: 04/14/2024  Check In:  Session Check In - 04/14/24 1020       Check-In   Supervising physician immediately available to respond to emergencies See telemetry face sheet for immediately available MD    Location [facility]    Staff Present Ronna Coho BSN, RN;Tina Tyonek, RN;Heather [place], [place], Exercise Physiologist    Virtual Visit No    Medication changes reported     No    Fall or balance concerns reported    No    Tobacco Cessation No Change    Warm-up and Cool-down Performed on first and last piece of equipment    Resistance Training Performed Yes    VAD Patient? No    PAD/SET Patient? No      Pain Assessment   Currently in Pain? No/denies    Multiple Pain Sites No             Capillary Blood Glucose: No results found for this or any previous visit (from the past 24 hours).    Social History   Tobacco Use  Smoking Status Never  Smokeless Tobacco Never    Goals Met:  Independence with exercise equipment Exercise tolerated well No report of concerns or symptoms today Strength training completed today  Goals Unmet:  Not Applicable  Comments: Aaron AasAaron AasPt able to follow exercise prescription today without complaint.  Will continue to monitor for progression.

## 2024-04-16 ENCOUNTER — Encounter (HOSPITAL_COMMUNITY)
Admission: RE | Admit: 2024-04-16 | Discharge: 2024-04-16 | Disposition: A | Discharge status: Home / Self Care | Source: Ambulatory Visit

## 2024-04-16 DIAGNOSIS — Z951 Presence of aortocoronary bypass graft: Secondary | ICD-10-CM

## 2024-04-16 DIAGNOSIS — I2111 ST elevation (STEMI) myocardial infarction involving right coronary artery: Secondary | ICD-10-CM | POA: Diagnosis not present

## 2024-04-16 NOTE — Progress Notes (Signed)
 Daily Session Note  Patient Details  Name: LARAMIE MEISSNER MRN: [id number] Date of Birth: [birth year] Referring Provider:   Flowsheet Row CARDIAC REHAB PHASE II ORIENTATION from 03/16/2024 in [facility] CARDIAC REHABILITATION  Referring Provider Anastacio Balm MD  [[place]]       Encounter Date: 04/16/2024  Check In:  Session Check In - 04/16/24 1049       Check-In   Supervising physician immediately available to respond to emergencies See telemetry face sheet for immediately available MD    Location [facility]    Staff Present Clotilda Danish, BS, Exercise Physiologist;Debra Lincoln Renshaw, RN, BSN    Virtual Visit No    Medication changes reported     No    Fall or balance concerns reported    No    Tobacco Cessation No Change    Warm-up and Cool-down Performed on first and last piece of equipment    Resistance Training Performed Yes    VAD Patient? No    PAD/SET Patient? No      Pain Assessment   Currently in Pain? No/denies             Capillary Blood Glucose: No results found for this or any previous visit (from the past 24 hours).    Social History   Tobacco Use  Smoking Status Never  Smokeless Tobacco Never    Goals Met:  Independence with exercise equipment Exercise tolerated well No report of concerns or symptoms today Strength training completed today  Goals Unmet:  Not Applicable  Comments: Pt able to follow exercise prescription today without complaint.  Will continue to monitor for progression.

## 2024-04-19 ENCOUNTER — Encounter (HOSPITAL_COMMUNITY)
Admission: RE | Admit: 2024-04-19 | Discharge: 2024-04-19 | Disposition: A | Discharge status: Home / Self Care | Source: Ambulatory Visit

## 2024-04-19 DIAGNOSIS — I2111 ST elevation (STEMI) myocardial infarction involving right coronary artery: Secondary | ICD-10-CM

## 2024-04-19 DIAGNOSIS — Z951 Presence of aortocoronary bypass graft: Secondary | ICD-10-CM

## 2024-04-19 NOTE — Progress Notes (Signed)
 Daily Session Note  Patient Details  Name: Aaron Smith MRN: [id number] Date of Birth: February 15, 1961 Referring Provider:   Flowsheet Row CARDIAC REHAB PHASE II ORIENTATION from 03/16/2024 in [facility] CARDIAC REHABILITATION  Referring Provider Anastacio Balm MD  [[place]]       Encounter Date: 04/19/2024  Check In:  Session Check In - 04/19/24 1102       Check-In   Supervising physician immediately available to respond to emergencies See telemetry face sheet for immediately available MD    Location [facility]    Staff Present Rita Cherry, MA, RCEP, CCRP, CCET;Brittany Annette Barters, BSN, RN, WTA-C    Virtual Visit No    Medication changes reported     No    Fall or balance concerns reported    No    Warm-up and Cool-down Performed on first and last piece of equipment    Resistance Training Performed Yes    VAD Patient? No    PAD/SET Patient? No      Pain Assessment   Currently in Pain? No/denies             Capillary Blood Glucose: No results found for this or any previous visit (from the past 24 hours).    Social History   Tobacco Use  Smoking Status Never  Smokeless Tobacco Never    Goals Met:  Independence with exercise equipment Exercise tolerated well No report of concerns or symptoms today Strength training completed today  Goals Unmet:  Not Applicable  Comments: Pt able to follow exercise prescription today without complaint.  Will continue to monitor for progression.

## 2024-04-21 ENCOUNTER — Encounter (HOSPITAL_COMMUNITY)

## 2024-04-23 ENCOUNTER — Encounter (HOSPITAL_COMMUNITY)
Admission: RE | Admit: 2024-04-23 | Discharge: 2024-04-23 | Disposition: A | Discharge status: Home / Self Care | Source: Ambulatory Visit

## 2024-04-23 DIAGNOSIS — Z951 Presence of aortocoronary bypass graft: Secondary | ICD-10-CM

## 2024-04-23 DIAGNOSIS — I2111 ST elevation (STEMI) myocardial infarction involving right coronary artery: Secondary | ICD-10-CM

## 2024-04-23 NOTE — Progress Notes (Signed)
 Daily Session Note  Patient Details  Name: Aaron Smith MRN: [id number] Date of Birth: 1961/06/11 Referring Provider:   Flowsheet Row CARDIAC REHAB PHASE II ORIENTATION from 03/16/2024 in [facility] CARDIAC REHABILITATION  Referring Provider Anastacio Balm MD  [[place]]    Encounter Date: 04/23/2024  Check In:  Session Check In - 04/23/24 1045       Check-In   Supervising physician immediately available to respond to emergencies See telemetry face sheet for immediately available MD    Location [facility]    Staff Present Clotilda Danish, BS, Exercise Physiologist;Brittany Annette Barters, BSN, RN, Tisha Forget, RN, BSN    Virtual Visit No    Medication changes reported     No    Fall or balance concerns reported    No    Tobacco Cessation No Change    Warm-up and Cool-down Performed on first and last piece of equipment    Resistance Training Performed Yes    VAD Patient? No    PAD/SET Patient? No      Pain Assessment   Currently in Pain? No/denies    Multiple Pain Sites No          Capillary Blood Glucose: No results found for this or any previous visit (from the past 24 hours).    Social History   Tobacco Use  Smoking Status Never  Smokeless Tobacco Never    Goals Met:  Independence with exercise equipment Exercise tolerated well No report of concerns or symptoms today Strength training completed today  Goals Unmet:  Not Applicable  Comments: Pt able to follow exercise prescription today without complaint.  Will continue to monitor for progression.

## 2024-04-26 ENCOUNTER — Encounter (HOSPITAL_COMMUNITY)
Admission: RE | Admit: 2024-04-26 | Discharge: 2024-04-26 | Disposition: A | Discharge status: Home / Self Care | Source: Ambulatory Visit

## 2024-04-26 DIAGNOSIS — Z951 Presence of aortocoronary bypass graft: Secondary | ICD-10-CM

## 2024-04-26 DIAGNOSIS — I2111 ST elevation (STEMI) myocardial infarction involving right coronary artery: Secondary | ICD-10-CM

## 2024-04-26 NOTE — Progress Notes (Signed)
 Daily Session Note  Patient Details  Name: Aaron Smith MRN: [id number] Date of Birth: April 25, 1961 Referring Provider:   Flowsheet Row CARDIAC REHAB PHASE II ORIENTATION from 03/16/2024 in [facility] CARDIAC REHABILITATION  Referring Provider Anastacio Balm MD  [[place]]    Encounter Date: 04/26/2024  Check In:  Session Check In - 04/26/24 1100       Check-In   Supervising physician immediately available to respond to emergencies See telemetry face sheet for immediately available MD    Location [facility]    Staff Present Clotilda Danish, BS, Exercise Physiologist;Brittany Annette Barters, BSN, RN, Berta Brittle, RN    Virtual Visit No    Medication changes reported     No    Fall or balance concerns reported    No    Tobacco Cessation No Change    Warm-up and Cool-down Performed on first and last piece of equipment    Resistance Training Performed Yes    VAD Patient? No    PAD/SET Patient? No      Pain Assessment   Currently in Pain? No/denies    Multiple Pain Sites No          Capillary Blood Glucose: No results found for this or any previous visit (from the past 24 hours).    Social History   Tobacco Use  Smoking Status Never  Smokeless Tobacco Never    Goals Met:  Independence with exercise equipment Exercise tolerated well No report of concerns or symptoms today Strength training completed today  Goals Unmet:  Not Applicable  Comments: Pt able to follow exercise prescription today without complaint.  Will continue to monitor for progression.

## 2024-04-28 ENCOUNTER — Encounter (HOSPITAL_COMMUNITY)

## 2024-04-28 NOTE — Progress Notes (Signed)
 Cardiac Individual Treatment Plan  Patient Details  Name: Aaron Smith MRN: [id number] Date of Birth: 1961/10/03 Referring Provider:   Flowsheet Row CARDIAC REHAB PHASE II ORIENTATION from 03/16/2024 in [facility] CARDIAC REHABILITATION  Referring Provider Anastacio Balm MD  [[place]]    Initial Encounter Date:  Flowsheet Row CARDIAC REHAB PHASE II ORIENTATION from 03/16/2024 in [facility] CARDIAC REHABILITATION  Date 03/16/24    Visit Diagnosis: ST elevation myocardial infarction involving right coronary artery (HCC)  S/P CABG x 5  Patient's Home Medications on Admission:  Current Outpatient Medications:    ASPIRIN LOW DOSE 81 MG tablet, Take 81 mg by mouth every morning., Disp: , Rfl:    Blood Glucose Monitoring Suppl (ACCU-CHEK GUIDE ME) w/Device KIT, 1 each by Does not apply route., Disp: , Rfl:    clindamycin (CLEOCIN) 300 MG capsule, , Disp: , Rfl:    clopidogrel (PLAVIX) 75 MG tablet, Take 75 mg by mouth daily., Disp: , Rfl:    Continuous Glucose Sensor (DEXCOM G7 SENSOR) MISC, 1 each by Does not apply route., Disp: , Rfl:    docusate sodium (COLACE) 100 MG capsule, Take 100 mg by mouth 2 (two) times daily as needed. (Patient not taking: Reported on 03/16/2024), Disp: , Rfl:    ferrous sulfate 325 (65 FE) MG tablet, Take 325 mg by mouth., Disp: , Rfl:    insulin aspart (NOVOLOG) 100 UNIT/ML FlexPen, Inject 4 Units into the skin., Disp: , Rfl:    Insulin Glargine Solostar (LANTUS) 100 UNIT/ML Solostar Pen, Inject 24 Units into the skin., Disp: , Rfl:    losartan (COZAAR) 25 MG tablet, Take 25 mg by mouth every morning., Disp: , Rfl:    Melatonin 5 MG CHEW, Chew 5 mg by mouth daily as needed (for sleep)., Disp: , Rfl:    metFORMIN  (GLUCOPHAGE ) 1000 MG tablet, Take 1 tablet (1,000 mg total) by mouth 2 (two) times daily with a meal., Disp: 180 tablet, Rfl: 0   metolazone  (ZAROXOLYN ) 5 MG tablet, Take 1 tablet (5 mg total) by mouth daily., Disp: 90 tablet, Rfl: 3   Multiple  Vitamin (MULTIVITAMIN) capsule, Take by mouth., Disp: , Rfl:    naproxen (NAPROSYN) 250 MG tablet, Take by mouth 2 (two) times daily with a meal., Disp: , Rfl:    rosuvastatin  (CRESTOR ) 40 MG tablet, Take 1 tablet (40 mg total) by mouth daily., Disp: 90 tablet, Rfl: 0   sildenafil  (REVATIO ) 20 MG tablet, Take 1-3 tabs po prn x1 for sex, Disp: 90 tablet, Rfl: 1   traZODone  (DESYREL ) 150 MG tablet, Take 1 tablet (150 mg total) by mouth at bedtime., Disp: 15 tablet, Rfl: 2  Past Medical History: Past Medical History:  Diagnosis Date   Diabetes mellitus without complication (HCC)    Hypertension     Tobacco Use: Social History   Tobacco Use  Smoking Status Never  Smokeless Tobacco Never    Labs: Review Flowsheet  More data exists      Latest Ref Rng & Units 12/05/2020 08/21/2021 12/26/2021 02/17/2023 08/27/2023  Labs for ITP Cardiac and Pulmonary Rehab  Cholestrol 100 - 199 mg/dL 782  956  - 213  086   LDL (calc) 0 - 99 mg/dL 578  83  - 93  469   HDL-C >39 mg/dL 38  33  - 37  31   Trlycerides 0 - 149 mg/dL 629  528  - 413  244   Hemoglobin A1c 4.8 - 5.6 % 8.5  9.4  7.3  10.0  -    Capillary Blood Glucose: Lab Results  Component Value Date   GLUCAP 146 (H) 03/19/2024   GLUCAP 153 (H) 03/17/2024   GLUCAP 209 (H) 03/17/2024   GLUCAP 170 (H) 10/07/2018   GLUCAP 145 (H) 10/07/2018     Exercise Target Goals: Exercise Program Goal: Individual exercise prescription set using results from initial 6 min walk test and THRR while considering  patient's activity barriers and safety.   Exercise Prescription Goal: Starting with aerobic activity 30 plus minutes a day, 3 days per week for initial exercise prescription. Provide home exercise prescription and guidelines that participant acknowledges understanding prior to discharge.  Activity Barriers & Risk Stratification:  Activity Barriers & Cardiac Risk Stratification - 03/16/24 0932       Activity Barriers & Cardiac Risk  Stratification   Activity Barriers Incisional Pain;Deconditioning    Cardiac Risk Stratification High          6 Minute Walk:  6 Minute Walk     Row Name 03/16/24 0932         6 Minute Walk   Phase Initial     Distance 1345 feet     Walk Time 6 minutes     # of Rest Breaks 0     MPH 2.55     METS 3.34     RPE 9     Perceived Dyspnea  0     VO2 Peak 11.68     Symptoms No     Resting HR 73 bpm     Resting BP 116/58     Resting Oxygen Saturation  99 %     Exercise Oxygen Saturation  during 6 min walk 96 %     Max Ex. HR 86 bpm     Max Ex. BP 128/70     2 Minute Post BP 126/72        Oxygen Initial Assessment:   Oxygen Re-Evaluation:   Oxygen Discharge (Final Oxygen Re-Evaluation):   Initial Exercise Prescription:  Initial Exercise Prescription - 03/16/24 0900       Date of Initial Exercise RX and Referring Provider   Date 03/16/24    Referring Provider Anastacio Balm MD   [facility]     Oxygen   Maintain Oxygen Saturation 88% or higher      Treadmill   MPH 2.5    Grade 1    Minutes 15    METs 3.26      Elliptical   Level 1    Speed 50    Minutes 15    METs 3.3      REL-XR   Level 3    Speed 50    Minutes 15    METs 3.3      Prescription Details   Frequency (times per week) 3    Duration Progress to 30 minutes of continuous aerobic without signs/symptoms of physical distress      Intensity   THRR 40-80% of Max Heartrate 107-140    Ratings of Perceived Exertion 11-13    Perceived Dyspnea 0-4      Progression   Progression Continue to progress workloads to maintain intensity without signs/symptoms of physical distress.      Resistance Training   Training Prescription Yes    Weight 3 lb    Reps 10-15          Perform Capillary Blood Glucose checks as needed.  Exercise Prescription Changes:  Exercise Prescription Changes     Row Name 03/16/24 0900 04/07/24 1100 04/12/24 1500         Response to Exercise   Blood  Pressure (Admit) 116/58 -- 110/72     Blood Pressure (Exercise) 128/70 -- --     Blood Pressure (Exit) 126/72 -- 112/60     Heart Rate (Admit) 73 bpm -- 76 bpm     Heart Rate (Exercise) 86 bpm -- 114 bpm     Heart Rate (Exit) 73 bpm -- 101 bpm     Oxygen Saturation (Admit) 99 % -- --     Oxygen Saturation (Exercise) 96 % -- --     Rating of Perceived Exertion (Exercise) 9 -- 13     Perceived Dyspnea (Exercise) 0 -- --     Symptoms none -- --     Comments walk test results -- --     Duration -- -- Continue with 30 min of aerobic exercise without signs/symptoms of physical distress.     Intensity -- -- THRR unchanged       Progression   Progression -- -- Continue to progress workloads to maintain intensity without signs/symptoms of physical distress.       Resistance Training   Training Prescription -- -- Yes     Weight -- -- 5     Reps -- -- 10-15       Treadmill   MPH -- -- 2.7     Grade -- -- 3.5     Minutes -- -- 15     METs -- -- 4.37       REL-XR   Level -- -- 6     Speed -- -- 58     Minutes -- -- 15     METs -- -- 4.2       Home Exercise Plan   Plans to continue exercise at -- Home (comment)  walking, weights Home (comment)     Frequency -- Add 3 additional days to program exercise sessions. Add 3 additional days to program exercise sessions.     Initial Home Exercises Provided -- 04/07/24 --        Exercise Comments:   Exercise Comments     Row Name 03/17/24 1041           Exercise Comments First full day of exercise!  Patient was oriented to gym and equipment including functions, settings, policies, and procedures.  Patient's individual exercise prescription and treatment plan were reviewed.  All starting workloads were established based on the results of the 6 minute walk test done at initial orientation visit.  The plan for exercise progression was also introduced and progression will be customized based on patient's performance and goals.           Exercise Goals and Review:   Exercise Goals     Row Name 03/11/24 [phone]             Exercise Goals   Increase Physical Activity Yes       Intervention Provide advice, education, support and counseling about physical activity/exercise needs.;Develop an individualized exercise prescription for aerobic and resistive training based on initial evaluation findings, risk stratification, comorbidities and participant's personal goals.       Expected Outcomes Short Term: Attend rehab on a regular basis to increase amount of physical activity.;Long Term: Exercising regularly at least 3-5 days a week.;Long Term: Add in home exercise to make exercise part of routine and to increase amount  of physical activity.       Increase Strength and Stamina Yes       Intervention Provide advice, education, support and counseling about physical activity/exercise needs.;Develop an individualized exercise prescription for aerobic and resistive training based on initial evaluation findings, risk stratification, comorbidities and participant's personal goals.       Expected Outcomes Short Term: Increase workloads from initial exercise prescription for resistance, speed, and METs.;Short Term: Perform resistance training exercises routinely during rehab and add in resistance training at home;Long Term: Improve cardiorespiratory fitness, muscular endurance and strength as measured by increased METs and functional capacity ( )       Able to understand and use rate of perceived exertion (RPE) scale Yes       Intervention Provide education and explanation on how to use RPE scale       Expected Outcomes Short Term: Able to use RPE daily in rehab to express subjective intensity level;Long Term:  Able to use RPE to guide intensity level when exercising independently       Able to understand and use Dyspnea scale Yes       Intervention Provide education and explanation on how to use Dyspnea scale       Expected Outcomes  Short Term: Able to use Dyspnea scale daily in rehab to express subjective sense of shortness of breath during exertion;Long Term: Able to use Dyspnea scale to guide intensity level when exercising independently       Knowledge and understanding of Target Heart Rate Range (THRR) Yes       Intervention Provide education and explanation of THRR including how the numbers were predicted and where they are located for reference       Expected Outcomes Short Term: Able to state/look up THRR;Short Term: Able to use daily as guideline for intensity in rehab;Long Term: Able to use THRR to govern intensity when exercising independently       Able to check pulse independently Yes       Intervention Provide education and demonstration on how to check pulse in carotid and radial arteries.;Review the importance of being able to check your own pulse for safety during independent exercise       Expected Outcomes Short Term: Able to explain why pulse checking is important during independent exercise;Long Term: Able to check pulse independently and accurately       Understanding of Exercise Prescription Yes       Intervention Provide education, explanation, and written materials on patient's individual exercise prescription       Expected Outcomes Short Term: Able to explain program exercise prescription;Long Term: Able to explain home exercise prescription to exercise independently          Exercise Goals Re-Evaluation :  Exercise Goals Re-Evaluation     Row Name 03/17/24 1042 04/07/24 1117           Exercise Goal Re-Evaluation   Exercise Goals Review Able to understand and use rate of perceived exertion (RPE) scale;Knowledge and understanding of Target Heart Rate Range (THRR) Increase Physical Activity;Increase Strength and Stamina;Able to understand and use rate of perceived exertion (RPE) scale;Able to understand and use Dyspnea scale;Knowledge and understanding of Target Heart Rate Range (THRR);Able to  check pulse independently;Understanding of Exercise Prescription      Comments Reviewed RPE and dyspnea scale, THR and program prescription with pt today.  Pt voiced understanding and was given a copy of goals to take home. Aaron Smith is doing well in rehab.  He is already walking at home and has dumbbells.  Reviewed home exercise with pt today.  Pt plans to walk at home and use hand weights too for exercise.  Reviewed THR, pulse, RPE, sign and symptoms, pulse oximetery and when to call 911 or MD.  Also discussed weather considerations and indoor options.  Pt voiced understanding.  He is starting to feel like his stamina is starting to come back.      Expected Outcomes Short: Use RPE daily to regulate intensity.  Long: Follow program prescription in THR. Short: Start to add in more exercise at home Long: Conitnue to exercise independently.          Discharge Exercise Prescription (Final Exercise Prescription Changes):  Exercise Prescription Changes - 04/12/24 1500       Response to Exercise   Blood Pressure (Admit) 110/72    Blood Pressure (Exit) 112/60    Heart Rate (Admit) 76 bpm    Heart Rate (Exercise) 114 bpm    Heart Rate (Exit) 101 bpm    Rating of Perceived Exertion (Exercise) 13    Duration Continue with 30 min of aerobic exercise without signs/symptoms of physical distress.    Intensity THRR unchanged      Progression   Progression Continue to progress workloads to maintain intensity without signs/symptoms of physical distress.      Resistance Training   Training Prescription Yes    Weight 5    Reps 10-15      Treadmill   MPH 2.7    Grade 3.5    Minutes 15    METs 4.37      REL-XR   Level 6    Speed 58    Minutes 15    METs 4.2      Home Exercise Plan   Plans to continue exercise at Home (comment)    Frequency Add 3 additional days to program exercise sessions.          Nutrition:  Target Goals: Understanding of nutrition guidelines, daily intake of sodium  1500mg , cholesterol 200mg , calories 30% from fat and 7% or less from saturated fats, daily to have 5 or more servings of fruits and vegetables.  Biometrics:  Pre Biometrics - 03/16/24 0935       Pre Biometrics   Height 5' 10.5 (1.791 m)    Weight 84.3 kg    Waist Circumference 36 inches    Hip Circumference 39 inches    Waist to Hip Ratio 0.92 %    BMI (Calculated) 26.29    Grip Strength 29.3 kg    Single Leg Stand 30 seconds           Nutrition Therapy Plan and Nutrition Goals:  Nutrition Therapy & Goals - 03/11/24 0823       Intervention Plan   Intervention Prescribe, educate and counsel regarding individualized specific dietary modifications aiming towards targeted core components such as weight, hypertension, lipid management, diabetes, heart failure and other comorbidities.;Nutrition handout(s) given to patient.    Expected Outcomes Short Term Goal: Understand basic principles of dietary content, such as calories, fat, sodium, cholesterol and nutrients.;Long Term Goal: Adherence to prescribed nutrition plan.          Nutrition Assessments:  MEDIFICTS Score Key: >=70 Need to make dietary changes  40-70 Heart Healthy Diet <= 40 Therapeutic Level Cholesterol Diet  Flowsheet Row CARDIAC REHAB PHASE II ORIENTATION from 03/16/2024 in [facility] CARDIAC REHABILITATION  Picture Your Plate Total Score on Admission 61  Picture Your Plate Scores: <60 Unhealthy dietary pattern with much room for improvement. 41-50 Dietary pattern unlikely to meet recommendations for good health and room for improvement. 51-60 More healthful dietary pattern, with some room for improvement.  >60 Healthy dietary pattern, although there may be some specific behaviors that could be improved.    Nutrition Goals Re-Evaluation:  Nutrition Goals Re-Evaluation     Row Name 04/07/24 1134             Goals   Nutrition Goal Heart Healthy Diet       Comment Aaron Smith has been working on improving  his diet.  He is easing into things and making small changes here and there.  He has always watched his salt.  His biggest change has been moving away from some cereals to better/healthier options like oatmeal.  He is getting a good variety of fruits and vegetables.  His favorites are pineapple and strawberries.  His teeth will sometimes give him a hard time with leafy greens. He likes romaine and carrots for his salads.  He has been also trying to find a new salad dressings.  They have also been eating more grilled chicken and different seasonings.       Expected Outcome Short: Continue to try out new salad dressings.  Long: Continue to focus on healthy eating.          Nutrition Goals Discharge (Final Nutrition Goals Re-Evaluation):  Nutrition Goals Re-Evaluation - 04/07/24 1134       Goals   Nutrition Goal Heart Healthy Diet    Comment Aaron Smith has been working on improving his diet.  He is easing into things and making small changes here and there.  He has always watched his salt.  His biggest change has been moving away from some cereals to better/healthier options like oatmeal.  He is getting a good variety of fruits and vegetables.  His favorites are pineapple and strawberries.  His teeth will sometimes give him a hard time with leafy greens. He likes romaine and carrots for his salads.  He has been also trying to find a new salad dressings.  They have also been eating more grilled chicken and different seasonings.    Expected Outcome Short: Continue to try out new salad dressings.  Long: Continue to focus on healthy eating.          Psychosocial: Target Goals: Acknowledge presence or absence of significant depression and/or stress, maximize coping skills, provide positive support system. Participant is able to verbalize types and ability to use techniques and skills needed for reducing stress and depression.  Initial Review & Psychosocial Screening:  Initial Psych Review & Screening -  03/11/24 0815       Initial Review   Current issues with Current Sleep Concerns;Current Stress Concerns    Source of Stress Concerns Unable to participate in former interests or hobbies;Unable to perform yard/household activities    Comments not a back sleeper and having a difficult time to sleep, restritctions to activity      Family Dynamics   Good Support System? Yes   wife     Barriers   Psychosocial barriers to participate in program The patient should benefit from training in stress management and relaxation.;Psychosocial barriers identified (see note)      Screening Interventions   Interventions Encouraged to exercise;To provide support and resources with identified psychosocial needs;Provide feedback about the scores to participant    Expected Outcomes Short Term goal: Utilizing psychosocial counselor, staff  and physician to assist with identification of specific Stressors or current issues interfering with healing process. Setting desired goal for each stressor or current issue identified.;Long Term Goal: Stressors or current issues are controlled or eliminated.;Short Term goal: Identification and review with participant of any Quality of Life or Depression concerns found by scoring the questionnaire.;Long Term goal: The participant improves quality of Life and PHQ9 Scores as seen by post scores and/or verbalization of changes          Quality of Life Scores:  Quality of Life - 03/16/24 0935       Quality of Life   Select Quality of Life      Quality of Life Scores   Health/Function Pre 17.2 %    Socioeconomic Pre 27.75 %    Psych/Spiritual Pre 24 %    Family Pre 24 %    GLOBAL Pre 21.94 %         Scores of 19 and below usually indicate a poorer quality of life in these areas.  A difference of  2-3 points is a clinically meaningful difference.  A difference of 2-3 points in the total score of the Quality of Life Index has been associated with significant improvement in  overall quality of life, self-image, physical symptoms, and general health in studies assessing change in quality of life.  PHQ-9: Review Flowsheet       04/12/2024 03/16/2024  Depression screen PHQ 2/9  Decreased Interest 1 1  Down, Depressed, Hopeless 1 1  PHQ - 2 Score 2 2  Altered sleeping 0 3  Tired, decreased energy 1 2  Change in appetite 1 2  Feeling bad or failure about yourself  1 1  Trouble concentrating 0 1  Moving slowly or fidgety/restless 0 1  Suicidal thoughts 0 0  PHQ-9 Score 5 12  Difficult doing work/chores Somewhat difficult Somewhat difficult   Interpretation of Total Score  Total Score Depression Severity:  1-4 = Minimal depression, 5-9 = Mild depression, 10-14 = Moderate depression, 15-19 = Moderately severe depression, 20-27 = Severe depression   Psychosocial Evaluation and Intervention:  Psychosocial Evaluation - 03/11/24 0843       Psychosocial Evaluation & Interventions   Interventions Stress management education;Encouraged to exercise with the program and follow exercise prescription    Comments Aaron Smith is coming into cardiac rehab after STEMI and bypass surgery in March.  [place] had his heart attack and presented at [place] then transported to [facility] in [place]. He is still having some incisional soreness since surgery, especially on right side.  Since surgery, he has stopped his insulin after having a few episodes of 40s-60s and talking with his doctor.  He is eager to get moving again and wants to build his strength back up.  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  He is also having a hard time sleeping since surgery.  He usually only gets a few hours a night.  He has been sleeping in bed and recliner on his back but is usually a side sleeper.  He really wants to  get back to sleeping.  He has tried some home remedies, but with occassional help and exhaustion to get some rest.  We talked about using  a supported side sleeping position to not harm chest as he is transitioning his sleep position to try to get more rest.  He is looking forward to program and learning his new limitiations and how hard to push himself.  His wife has been his support through all this and she plans to attend orientation with him as well.  She is also on him about taking all of his medications and montioring his blood sugars.  He is now using a Dexcom and has found it very helpful in maintain his sugars.  He will only use his insulin on the rare occassions that his sugar is too high.    Expected Outcomes Short: Attend rehab to build up strength and stamina Long: Continue to cope with restrictions until lifted    Continue Psychosocial Services  Follow up required by staff          Psychosocial Re-Evaluation:  Psychosocial Re-Evaluation     Row Name 03/19/24 1210 04/07/24 1124           Psychosocial Re-Evaluation   Current issues with Current Sleep Concerns Current Sleep Concerns;Current Depression      Comments Aaron Smith is off to a good start.  Continues to have issues with sleeping. He has been trying out melatonin.  We talked about some different options to try and sent him some sleep information from [facility] and on sleep hygiene to review. We also discussed trying supported side sleeping. Aaron Smith is doing well in rehab.  His still having issues sleeping as he is still on his back. He was encouraged to try to transition to his side with support.  His lack of sleep and cooler weather have him feeling depressed some days, but he feels like he is managing okay.  He also notes that he is more emotional now but aware of how he is feeling. He did review sleep stuff and has tried melatonin and found it helps some.  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 He is now two months out and starting to countdown to getting off restrictions.  He has enjoyed coming to classes and getting to meet other people and learn more.      Expected Outcomes Short: Review sleep info Long: Improved sleep quality. Short: Continue to work on sleep Long: Continue to stay positive      Interventions Encouraged to attend Cardiac Rehabilitation for the exercise;Stress management education;Relaxation education Encouraged to attend Cardiac Rehabilitation for the exercise;Stress management education;Relaxation education      Continue Psychosocial Services  Follow up required by staff Follow up required by staff         Psychosocial Discharge (Final Psychosocial Re-Evaluation):  Psychosocial Re-Evaluation - 04/07/24 1124       Psychosocial Re-Evaluation   Current issues with Current Sleep Concerns;Current Depression    Comments Aaron Smith is doing well in rehab.  His still having issues sleeping as he is still on his back. He was encouraged to try to transition to his side with support.  His lack of sleep and cooler weather have him feeling depressed some days, but he feels like he is managing okay.  He also notes that he is more emotional now but aware of how he is feeling. He did review sleep stuff and has tried melatonin and found it helps some.  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 He is now two months out and starting to countdown to getting off restrictions.  He has enjoyed coming to classes and getting to meet other people and learn more.    Expected Outcomes Short: Continue to work on sleep Long: Continue to stay positive    Interventions Encouraged to attend Cardiac Rehabilitation for the exercise;Stress management education;Relaxation education    Continue Psychosocial Services  Follow up required by staff          Vocational Rehabilitation: Provide vocational rehab assistance to  qualifying candidates.   Vocational Rehab Evaluation & Intervention:  Vocational Rehab - 03/11/24 1610       Initial Vocational Rehab Evaluation & Intervention   Assessment shows need for Vocational Rehabilitation No   retired         Education: Education Goals: Education classes will be provided on a weekly basis, covering required topics. Participant will state understanding/return demonstration of topics presented.  Learning Barriers/Preferences:  Learning Barriers/Preferences - 03/11/24 0810       Learning Barriers/Preferences   Learning Barriers Sight   contacts and reading glasses   Learning Preferences None          Education Topics: Hypertension, Hypertension Reduction -Define heart disease and high blood pressure. Discus how high blood pressure affects the body and ways to reduce high blood pressure.   Exercise and Your Heart -Discuss why it is important to exercise, the FITT principles of exercise, normal and abnormal responses to exercise, and how to exercise safely.   Angina -Discuss definition of angina, causes of angina, treatment of angina, and how to decrease risk of having angina.   Cardiac Medications -Review what the following cardiac medications are used for, how they affect the body, and side effects that may occur when taking the medications.  Medications include Aspirin, Beta blockers, calcium  channel blockers, ACE Inhibitors, angiotensin receptor blockers, diuretics, digoxin, and antihyperlipidemics. Flowsheet Row CARDIAC REHAB PHASE II EXERCISE from 04/14/2024 in [place] CARDIAC REHABILITATION  Date 04/07/24  Educator [email or web address]  Instruction Review Code 1- Verbalizes Understanding    Congestive Heart Failure -Discuss the definition of CHF, how to live with CHF, the signs and symptoms of CHF, and how keep track of weight and sodium intake.   Heart Disease and Intimacy -Discus the effect sexual activity has on the heart, how changes occur during  intimacy as we age, and safety during sexual activity.   Smoking Cessation / COPD -Discuss different methods to quit smoking, the health benefits of quitting smoking, and the definition of COPD.   Nutrition I: Fats -Discuss the types of cholesterol, what cholesterol does to the heart, and how cholesterol levels can be controlled. Flowsheet Row CARDIAC REHAB PHASE II EXERCISE from 04/14/2024 in [place] CARDIAC REHABILITATION  Date 03/17/24  Educator jh  Instruction Review Code 1- Verbalizes Understanding    Nutrition II: Labels -Discuss the different components of food labels and how to read food label   Heart Parts/Heart Disease and PAD -Discuss the anatomy of the heart, the pathway of blood circulation through the heart, and these are affected by heart disease.   Stress I: Signs and Symptoms -Discuss the causes of stress, how stress may lead to anxiety and depression, and ways to limit stress. Flowsheet Row CARDIAC REHAB PHASE II EXERCISE from 04/14/2024 in [place] CARDIAC REHABILITATION  Date 03/24/24  Educator HB  Instruction Review Code 1- Verbalizes Understanding    Stress II: Relaxation -Discuss  different types of relaxation techniques to limit stress.   Warning Signs of Stroke / TIA -Discuss definition of a stroke, what the signs and symptoms are of a stroke, and how to identify when someone is having stroke.   Knowledge Questionnaire Score:  Knowledge Questionnaire Score - 03/16/24 0936       Knowledge Questionnaire Score   Pre Score 21/24          Core Components/Risk Factors/Patient Goals at Admission:  Personal Goals and Risk Factors at Admission - 03/16/24 0936       Core Components/Risk Factors/Patient Goals on Admission    Weight Management Yes;Weight Loss;Weight Maintenance    Intervention Weight Management: Develop a combined nutrition and exercise program designed to reach desired caloric intake, while maintaining appropriate intake of  nutrient and fiber, sodium and fats, and appropriate energy expenditure required for the weight goal.;Weight Management: Provide education and appropriate resources to help participant work on and attain dietary goals.;Weight Management/Obesity: Establish reasonable short term and long term weight goals.    Admit Weight 185 lb 14.4 oz (84.3 kg)    Goal Weight: Short Term 180 lb (81.6 kg)    Goal Weight: Long Term 180 lb (81.6 kg)    Expected Outcomes Short Term: Continue to assess and modify interventions until short term weight is achieved;Long Term: Adherence to nutrition and physical activity/exercise program aimed toward attainment of established weight goal;Weight Loss: Understanding of general recommendations for a balanced deficit meal plan, which promotes 1-2 lb weight loss per week and includes a negative energy balance of [phone] kcal/d;Understanding recommendations for meals to include 15-35% energy as protein, 25-35% energy from fat, 35-60% energy from carbohydrates, less than 200mg  of dietary cholesterol, 20-35 gm of total fiber daily;Understanding of distribution of calorie intake throughout the day with the consumption of 4-5 meals/snacks;Weight Maintenance: Understanding of the daily nutrition guidelines, which includes 25-35% calories from fat, 7% or less cal from saturated fats, less than 200mg  cholesterol, less than 1.5gm of sodium, & 5 or more servings of fruits and vegetables daily    Diabetes Yes    Intervention Provide education about signs/symptoms and action to take for hypo/hyperglycemia.;Provide education about proper nutrition, including hydration, and aerobic/resistive exercise prescription along with prescribed medications to achieve blood glucose in normal ranges: Fasting glucose 65-99 mg/dL    Expected Outcomes Short Term: Participant verbalizes understanding of the signs/symptoms and immediate care of hyper/hypoglycemia, proper foot care and importance of medication,  aerobic/resistive exercise and nutrition plan for blood glucose control.;Long Term: Attainment of HbA1C < 7%.    Hypertension Yes    Intervention Provide education on lifestyle modifcations including regular physical activity/exercise, weight management, moderate sodium restriction and increased consumption of fresh fruit, vegetables, and low fat dairy, alcohol moderation, and smoking cessation.;Monitor prescription use compliance.    Expected Outcomes Short Term: Continued assessment and intervention until BP is < 140/87mm HG in hypertensive participants. < 130/39mm HG in hypertensive participants with diabetes, heart failure or chronic kidney disease.;Long Term: Maintenance of blood pressure at goal levels.    Lipids Yes    Intervention Provide education and support for participant on nutrition & aerobic/resistive exercise along with prescribed medications to achieve LDL 70mg , HDL >40mg .    Expected Outcomes Short Term: Participant states understanding of desired cholesterol values and is compliant with medications prescribed. Participant is following exercise prescription and nutrition guidelines.;Long Term: Cholesterol controlled with medications as prescribed, with individualized exercise RX and with personalized nutrition plan. Value goals: LDL <  70mg , HDL > 40 mg.          Core Components/Risk Factors/Patient Goals Review:   Goals and Risk Factor Review     Row Name 04/07/24 1129             Core Components/Risk Factors/Patient Goals Review   Personal Goals Review Weight Management/Obesity;Hypertension;Diabetes;Lipids       Review Aaron Smith is doing well in rehab.  His weight is holding steady and trends down overall since before his heart event.  His sugars are doing well and he has restarted his insullin for 8 units at night and it has been steady. He is checking them at home.  His pressures are doing well in the 130/70s and he checks those at home as well.  He is doing well on his  medicaitons.       Expected Outcomes Short: Continue to monitor sugars and pressure closely Long: Continue to monitor risk factors          Core Components/Risk Factors/Patient Goals at Discharge (Final Review):   Goals and Risk Factor Review - 04/07/24 1129       Core Components/Risk Factors/Patient Goals Review   Personal Goals Review Weight Management/Obesity;Hypertension;Diabetes;Lipids    Review Aaron Smith is doing well in rehab.  His weight is holding steady and trends down overall since before his heart event.  His sugars are doing well and he has restarted his insullin for 8 units at night and it has been steady. He is checking them at home.  His pressures are doing well in the 130/70s and he checks those at home as well.  He is doing well on his medicaitons.    Expected Outcomes Short: Continue to monitor sugars and pressure closely Long: Continue to monitor risk factors          ITP Comments:  ITP Comments     Row Name 03/11/24 0835 03/16/24 0930 03/17/24 1041 03/31/24 0955 04/28/24 0905   ITP Comments Completed virtual orientation today.  EP evaluation is scheduled for Tuesday 03/18/24 at 830 .  Documentation for diagnosis can be found in CE for [facility] 01/31/24 . Patient attend orientation today.  Patient is attending Cardiac Rehabilitation Program.  Documentation for diagnosis can be found in media and CE for [facility] encounter date 01/31/24.  Reviewed medical chart, RPE/RPD, gym safety, and program guidelines.  Patient was fitted to equipment they will be using during rehab.  Patient is scheduled to start exercise on Wednesday 03/17/24 at 1100.   Initial ITP created and sent for review and signature by Dr. Armida Lander, Medical Director for Cardiac Rehabilitation Program. First full day of exercise!  Patient was oriented to gym and equipment including functions, settings, policies, and procedures.  Patient's individual exercise prescription and treatment plan were reviewed.   All starting workloads were established based on the results of the 6 minute walk test done at initial orientation visit.  The plan for exercise progression was also introduced and progression will be customized based on patient's performance and goals. 30 day review completed. ITP sent to Dr. Armida Lander, Medical Director of Cardiac Rehab. Continue with ITP unless changes are made by physician.  Newer to program 30 day review completed. ITP sent to Dr. Armida Lander, Medical Director of Cardiac Rehab. Continue with ITP unless changes are made by physician.      Comments: 30 Day Review

## 2024-04-30 ENCOUNTER — Encounter (HOSPITAL_COMMUNITY)

## 2024-05-03 ENCOUNTER — Encounter (HOSPITAL_COMMUNITY)
Admission: RE | Admit: 2024-05-03 | Discharge: 2024-05-03 | Disposition: A | Discharge status: Home / Self Care | Source: Ambulatory Visit

## 2024-05-03 DIAGNOSIS — Z951 Presence of aortocoronary bypass graft: Secondary | ICD-10-CM

## 2024-05-03 DIAGNOSIS — I2111 ST elevation (STEMI) myocardial infarction involving right coronary artery: Secondary | ICD-10-CM

## 2024-05-03 NOTE — Progress Notes (Signed)
 Daily Session Note  Patient Details  Name: Aaron Smith MRN: [id number] Date of Birth: 1961/01/06 Referring Provider:   Flowsheet Row CARDIAC REHAB PHASE II ORIENTATION from 03/16/2024 in [facility] CARDIAC REHABILITATION  Referring Provider Luke Sensing MD  [[place]]    Encounter Date: 05/03/2024  Check In:  Session Check In - 05/03/24 1100       Check-In   Supervising physician immediately available to respond to emergencies See telemetry face sheet for immediately available MD    Location [facility]    Staff Present Powell Benders, BS, Exercise Physiologist;Brittany Jackquline, BSN, RN, Estrella Daring, BS, RRT, CPFT    Virtual Visit No    Medication changes reported     No    Fall or balance concerns reported    No    Tobacco Cessation No Change    Warm-up and Cool-down Performed on first and last piece of equipment    Resistance Training Performed Yes    VAD Patient? No    PAD/SET Patient? No      Pain Assessment   Currently in Pain? No/denies    Multiple Pain Sites No          Capillary Blood Glucose: No results found for this or any previous visit (from the past 24 hours).    Social History   Tobacco Use  Smoking Status Never  Smokeless Tobacco Never    Goals Met:  Independence with exercise equipment Exercise tolerated well No report of concerns or symptoms today Strength training completed today  Goals Unmet:  Not Applicable  Comments: Pt able to follow exercise prescription today without complaint.  Will continue to monitor for progression.

## 2024-05-05 ENCOUNTER — Encounter (HOSPITAL_COMMUNITY)
Admission: RE | Admit: 2024-05-05 | Discharge: 2024-05-05 | Disposition: A | Discharge status: Home / Self Care | Source: Ambulatory Visit

## 2024-05-05 DIAGNOSIS — I2111 ST elevation (STEMI) myocardial infarction involving right coronary artery: Secondary | ICD-10-CM | POA: Diagnosis not present

## 2024-05-05 DIAGNOSIS — Z951 Presence of aortocoronary bypass graft: Secondary | ICD-10-CM

## 2024-05-05 NOTE — Progress Notes (Signed)
 Daily Session Note  Patient Details  Name: Aaron Smith MRN: [id number] Date of Birth: 07/20/1961 Referring Provider:   Flowsheet Row CARDIAC REHAB PHASE II ORIENTATION from 03/16/2024 in [facility] CARDIAC REHABILITATION  Referring Provider Luke Sensing MD  [[place]]    Encounter Date: 05/05/2024  Check In:  Session Check In - 05/05/24 1028       Check-In   Supervising physician immediately available to respond to emergencies See telemetry face sheet for immediately available MD    Location [facility]    Staff Present Powell Benders, BS, Exercise Physiologist;Preslyn Warr Jackquline, BSN, RN, WTA-C;Other   Myrick Browner, NT   Virtual Visit No    Medication changes reported     No    Fall or balance concerns reported    No    Tobacco Cessation No Change    Warm-up and Cool-down Performed on first and last piece of equipment    Resistance Training Performed Yes    VAD Patient? No    PAD/SET Patient? No      Pain Assessment   Currently in Pain? No/denies          Capillary Blood Glucose: No results found for this or any previous visit (from the past 24 hours).    Social History   Tobacco Use  Smoking Status Never  Smokeless Tobacco Never    Goals Met:  Independence with exercise equipment Exercise tolerated well No report of concerns or symptoms today Strength training completed today  Goals Unmet:  Not Applicable  Comments: Pt able to follow exercise prescription today without complaint.  Will continue to monitor for progression.

## 2024-05-07 ENCOUNTER — Encounter (HOSPITAL_COMMUNITY)
Admission: RE | Admit: 2024-05-07 | Discharge: 2024-05-07 | Disposition: A | Discharge status: Home / Self Care | Source: Ambulatory Visit

## 2024-05-07 DIAGNOSIS — I2111 ST elevation (STEMI) myocardial infarction involving right coronary artery: Secondary | ICD-10-CM | POA: Diagnosis not present

## 2024-05-07 DIAGNOSIS — Z951 Presence of aortocoronary bypass graft: Secondary | ICD-10-CM

## 2024-05-07 NOTE — Progress Notes (Signed)
 Daily Session Note  Patient Details  Name: Aaron Smith MRN: [id number] Date of Birth: 12/26/1960 Referring Provider:   Flowsheet Row CARDIAC REHAB PHASE II ORIENTATION from 03/16/2024 in [facility] CARDIAC REHABILITATION  Referring Provider Luke Sensing MD  [[place]]    Encounter Date: 05/07/2024  Check In:  Session Check In - 05/07/24 1045       Check-In   Supervising physician immediately available to respond to emergencies See telemetry face sheet for immediately available MD    Location [facility]    Staff Present Adrien Louder, RN, BSN;Heather Con, BS, Exercise Physiologist;Laureen Delores, BS, RRT, CPFT    Virtual Visit No    Medication changes reported     No    Fall or balance concerns reported    No    Warm-up and Cool-down Performed on first and last piece of equipment    Resistance Training Performed Yes    VAD Patient? No    PAD/SET Patient? No      Pain Assessment   Currently in Pain? No/denies    Multiple Pain Sites No          Capillary Blood Glucose: No results found for this or any previous visit (from the past 24 hours).    Social History   Tobacco Use  Smoking Status Never  Smokeless Tobacco Never    Goals Met:  Independence with exercise equipment Exercise tolerated well No report of concerns or symptoms today Strength training completed today  Goals Unmet:  Not Applicable  Comments: Pt able to follow exercise prescription today without complaint.  Will continue to monitor for progression.

## 2024-05-10 ENCOUNTER — Encounter (HOSPITAL_COMMUNITY)
Admission: RE | Admit: 2024-05-10 | Discharge: 2024-05-10 | Disposition: A | Discharge status: Home / Self Care | Source: Ambulatory Visit

## 2024-05-10 DIAGNOSIS — Z951 Presence of aortocoronary bypass graft: Secondary | ICD-10-CM

## 2024-05-10 DIAGNOSIS — I2111 ST elevation (STEMI) myocardial infarction involving right coronary artery: Secondary | ICD-10-CM | POA: Diagnosis not present

## 2024-05-10 NOTE — Progress Notes (Signed)
 Daily Session Note  Patient Details  Name: ANDRUS SHARP MRN: [id number] Date of Birth: Jan 30, 1961 Referring Provider:   Flowsheet Row CARDIAC REHAB PHASE II ORIENTATION from 03/16/2024 in [facility] CARDIAC REHABILITATION  Referring Provider Luke Sensing MD  [[place]]    Encounter Date: 05/10/2024  Check In:  Session Check In - 05/10/24 1053       Check-In   Supervising physician immediately available to respond to emergencies See telemetry face sheet for immediately available MD    Location [facility]    Staff Present Powell Benders, BS, Exercise Physiologist;Laureen Delores, BS, RRT, CPFT    Virtual Visit No    Medication changes reported     No    Fall or balance concerns reported    No    Tobacco Cessation No Change    Warm-up and Cool-down Performed on first and last piece of equipment    Resistance Training Performed Yes    VAD Patient? No    PAD/SET Patient? No      Pain Assessment   Currently in Pain? No/denies          Capillary Blood Glucose: No results found for this or any previous visit (from the past 24 hours).    Social History   Tobacco Use  Smoking Status Never  Smokeless Tobacco Never    Goals Met:  Independence with exercise equipment Exercise tolerated well No report of concerns or symptoms today Strength training completed today  Goals Unmet:  Not Applicable  Comments: Pt able to follow exercise prescription today without complaint.  Will continue to monitor for progression.

## 2024-05-12 ENCOUNTER — Encounter (HOSPITAL_COMMUNITY)

## 2024-05-17 ENCOUNTER — Encounter (HOSPITAL_COMMUNITY)

## 2024-05-19 ENCOUNTER — Encounter (HOSPITAL_COMMUNITY)

## 2024-05-21 ENCOUNTER — Encounter (HOSPITAL_COMMUNITY)

## 2024-05-24 ENCOUNTER — Encounter (HOSPITAL_COMMUNITY)
Admission: RE | Admit: 2024-05-24 | Discharge: 2024-05-24 | Disposition: A | Discharge status: Home / Self Care | Source: Ambulatory Visit

## 2024-05-24 DIAGNOSIS — I2111 ST elevation (STEMI) myocardial infarction involving right coronary artery: Secondary | ICD-10-CM | POA: Diagnosis present

## 2024-05-24 DIAGNOSIS — Z951 Presence of aortocoronary bypass graft: Secondary | ICD-10-CM | POA: Insufficient documentation

## 2024-05-24 NOTE — Progress Notes (Signed)
 Daily Session Note  Patient Details  Name: Aaron Smith MRN: [id number] Date of Birth: [birth year] Referring Provider:   Flowsheet Row CARDIAC REHAB PHASE II ORIENTATION from 03/16/2024 in [facility] CARDIAC REHABILITATION  Referring Provider Luke Sensing MD  [[place]]    Encounter Date: 05/24/2024  Check In:  Session Check In - 05/24/24 1100       Check-In   Supervising physician immediately available to respond to emergencies See telemetry face sheet for immediately available MD    Location [facility]    Staff Present Powell Benders, BS, Exercise Physiologist;Brittany Jackquline, BSN, RN, Rosalba Gelineau, MA, RCEP, CCRP, CCET    Virtual Visit No    Medication changes reported     No    Fall or balance concerns reported    No    Tobacco Cessation No Change    Warm-up and Cool-down Performed on first and last piece of equipment    Resistance Training Performed Yes    VAD Patient? No    PAD/SET Patient? No      Pain Assessment   Currently in Pain? No/denies    Multiple Pain Sites No          Capillary Blood Glucose: No results found for this or any previous visit (from the past 24 hours).    Social History   Tobacco Use  Smoking Status Never  Smokeless Tobacco Never    Goals Met:  Independence with exercise equipment Exercise tolerated well No report of concerns or symptoms today Strength training completed today  Goals Unmet:  Not Applicable  Comments: Pt able to follow exercise prescription today without complaint.  Will continue to monitor for progression.

## 2024-05-26 ENCOUNTER — Encounter (HOSPITAL_COMMUNITY): Payer: Self-pay | Admitting: *Deleted

## 2024-05-26 ENCOUNTER — Encounter (HOSPITAL_COMMUNITY)
Admission: RE | Admit: 2024-05-26 | Discharge: 2024-05-26 | Disposition: A | Discharge status: Home / Self Care | Source: Ambulatory Visit

## 2024-05-26 DIAGNOSIS — Z951 Presence of aortocoronary bypass graft: Secondary | ICD-10-CM

## 2024-05-26 DIAGNOSIS — I2111 ST elevation (STEMI) myocardial infarction involving right coronary artery: Secondary | ICD-10-CM

## 2024-05-26 NOTE — Progress Notes (Signed)
 Cardiac Individual Treatment Plan  Patient Details  Name: Aaron Smith MRN: [id number] Date of Birth: 1960/11/24 Referring Provider:   Flowsheet Row CARDIAC REHAB PHASE II ORIENTATION from 03/16/2024 in [facility] CARD[facility]AC [facility]HABILITATION  Referring Provider Luke Sensing MD  [[place]]    Initial Encounter Date:  Flowsheet Row CARDIAC REHAB PHASE II ORIENTATION from 03/16/2024 in [place] [facility] CARDIAC REHABILITATION  Date 03/16/24    Visit Diagnosis: ST elevation myocardial infarction involving right coronary artery (HCC)  S/P CABG x 5  Patient's Home Medications on Admission:  Current Outpatient Medications:    ASPIRIN LOW DOSE 81 MG tablet, Take 81 mg by mouth every morning., Disp: , Rfl:    Blood Glucose Monitoring Suppl (ACCU-CHEK GUIDE ME) w/Device KIT, 1 each by Does not apply route., Disp: , Rfl:    clindamycin (CLEOCIN) 300 MG capsule, , Disp: , Rfl:    clopidogrel (PLAVIX) 75 MG tablet, Take 75 mg by mouth daily., Disp: , Rfl:    Continuous Glucose Sensor (DEXCOM G7 SENSOR) MISC, 1 each by Does not apply route., Disp: , Rfl:    docusate sodium (COLACE) 100 MG capsule, Take 100 mg by mouth 2 (two) times daily as needed. (Patient not taking: Reported on 03/16/2024), Disp: , Rfl:    ferrous sulfate 325 (65 FE) MG tablet, Take 325 mg by mouth., Disp: , Rfl:    insulin aspart (NOVOLOG) 100 UNIT/ML FlexPen, Inject 4 Units into the skin., Disp: , Rfl:    Insulin Glargine Solostar (LANTUS) 100 UNIT/ML Solostar Pen, Inject 24 Units into the skin., Disp: , Rfl:    losartan (COZAAR) 25 MG tablet, Take 25 mg by mouth every morning., Disp: , Rfl:    Melatonin 5 MG CHEW, Chew 5 mg by mouth daily as needed (for sleep)., Disp: , Rfl:    metFORMIN  (GLUCOPHAGE ) 1000 MG tablet, Take 1 tablet (1,000 mg total) by mouth 2 (two) times daily with a meal., Disp: 180 tablet, Rfl: 0   metolazone  (ZAROXOLYN ) 5 MG tablet, Take 1 tablet (5 mg total) by mouth daily., Disp: 90 tablet, Rfl: 3   Multiple  Vitamin (MULTIVITAMIN) capsule, Take by mouth., Disp: , Rfl:    naproxen (NAPROSYN) 250 MG tablet, Take by mouth 2 (two) times daily with a meal., Disp: , Rfl:    rosuvastatin  (CRESTOR ) 40 MG tablet, Take 1 tablet (40 mg total) by mouth daily., Disp: 90 tablet, Rfl: 0   sildenafil  (REVATIO ) 20 MG tablet, Take 1-3 tabs po prn x1 for sex, Disp: 90 tablet, Rfl: 1   traZODone  (DESYREL ) 150 MG tablet, Take 1 tablet (150 mg total) by mouth at bedtime., Disp: 15 tablet, Rfl: 2  Past Medical History: Past Medical History:  Diagnosis Date   Diabetes mellitus without complication (HCC)    Hypertension     Tobacco Use: Social History   Tobacco Use  Smoking Status Never  Smokeless Tobacco Never    Labs: Review Flowsheet  More data exists      Latest Ref Rng & Units 12/05/2020 08/21/2021 12/26/2021 02/17/2023 08/27/2023  Labs for ITP Cardiac and Pulmonary Rehab  Cholestrol 100 - 199 mg/dL 817  850  - 828  787   LDL (calc) 0 - 99 mg/dL 891  83  - 93  874   HDL-C >39 mg/dL 38  33  - 37  31   Trlycerides 0 - 149 mg/dL 790  803  - 760  682   Hemoglobin A1c 4.8 - 5.6 % 8.5  9.4  7.3  10.0  -    Capillary Blood Glucose: Lab Results  Component Value Date   GLUCAP 146 (H) 03/19/2024   GLUCAP 153 (H) 03/17/2024   GLUCAP 209 (H) 03/17/2024   GLUCAP 170 (H) 10/07/2018   GLUCAP 145 (H) 10/07/2018     Exercise Target Goals: Exercise Program Goal: Individual exercise prescription set using results from initial 6 min walk test and THRR while considering  patient's activity barriers and safety.   Exercise Prescription Goal: Starting with aerobic activity 30 plus minutes a day, 3 days per week for initial exercise prescription. Provide home exercise prescription and guidelines that participant acknowledges understanding prior to discharge.  Activity Barriers & Risk Stratification:  Activity Barriers & Cardiac Risk Stratification - 03/16/24 0932       Activity Barriers & Cardiac Risk  Stratification   Activity Barriers Incisional Pain;Deconditioning    Cardiac Risk Stratification High          6 Minute Walk:  6 Minute Walk     Row Name 03/16/24 0932         6 Minute Walk   Phase Initial     Distance 1345 feet     Walk Time 6 minutes     # of Rest Breaks 0     MPH 2.55     METS 3.34     RPE 9     Perceived Dyspnea  0     VO2 Peak 11.68     Symptoms No     Resting HR 73 bpm     Resting BP 116/58     Resting Oxygen Saturation  99 %     Exercise Oxygen Saturation  during 6 min walk 96 %     Max Ex. HR 86 bpm     Max Ex. BP 128/70     2 Minute Post BP 126/72        Oxygen Initial Assessment:   Oxygen Re-Evaluation:   Oxygen Discharge (Final Oxygen Re-Evaluation):   Initial Exercise Prescription:  Initial Exercise Prescription - 03/16/24 0900       Date of Initial Exercise RX and Referring Provider   Date 03/16/24    Referring Provider Luke Sensing MD   [facility]     Oxygen   Maintain Oxygen Saturation 88% or higher      Treadmill   MPH 2.5    Grade 1    Minutes 15    METs 3.26      Elliptical   Level 1    Speed 50    Minutes 15    METs 3.3      REL-XR   Level 3    Speed 50    Minutes 15    METs 3.3      Prescription Details   Frequency (times per week) 3    Duration Progress to 30 minutes of continuous aerobic without signs/symptoms of physical distress      Intensity   THRR 40-80% of Max Heartrate 107-140    Ratings of Perceived Exertion 11-13    Perceived Dyspnea 0-4      Progression   Progression Continue to progress workloads to maintain intensity without signs/symptoms of physical distress.      Resistance Training   Training Prescription Yes    Weight 3 lb    Reps 10-15          Perform Capillary Blood Glucose checks as needed.  Exercise Prescription Changes:  Exercise Prescription Changes     Row Name 03/16/24 0900 04/07/24 1100 04/12/24 1500 05/03/24 1500       Response to Exercise    Blood Pressure (Admit) 116/58 -- 110/72 110/60    Blood Pressure (Exercise) 128/70 -- -- --    Blood Pressure (Exit) 126/72 -- 112/60 100/60    Heart Rate (Admit) 73 bpm -- 76 bpm 68 bpm    Heart Rate (Exercise) 86 bpm -- 114 bpm 109 bpm    Heart Rate (Exit) 73 bpm -- 101 bpm 68 bpm    Oxygen Saturation (Admit) 99 % -- -- --    Oxygen Saturation (Exercise) 96 % -- -- --    Rating of Perceived Exertion (Exercise) 9 -- 13 12    Perceived Dyspnea (Exercise) 0 -- -- --    Symptoms none -- -- --    Comments walk test results -- -- --    Duration -- -- Continue with 30 min of aerobic exercise without signs/symptoms of physical distress. Continue with 30 min of aerobic exercise without signs/symptoms of physical distress.    Intensity -- -- THRR unchanged THRR unchanged      Progression   Progression -- -- Continue to progress workloads to maintain intensity without signs/symptoms of physical distress. Continue to progress workloads to maintain intensity without signs/symptoms of physical distress.      Resistance Training   Training Prescription -- -- Yes Yes    Weight -- -- 5 5    Reps -- -- 10-15 10-15      Treadmill   MPH -- -- 2.7 3    Grade -- -- 3.5 3    Minutes -- -- 15 15    METs -- -- 4.37 4.54      REL-XR   Level -- -- 6 6    Speed -- -- 58 62    Minutes -- -- 15 15    METs -- -- 4.2 5.7      Home Exercise Plan   Plans to continue exercise at -- Home (comment)  walking, weights Home (comment) Home (comment)    Frequency -- Add 3 additional days to program exercise sessions. Add 3 additional days to program exercise sessions. Add 3 additional days to program exercise sessions.    Initial Home Exercises Provided -- 04/07/24 -- --       Exercise Comments:   Exercise Comments     Row Name 03/17/24 1041           Exercise Comments First full day of exercise!  Patient was oriented to gym and equipment including functions, settings, policies, and procedures.  Patient's  individual exercise prescription and treatment plan were reviewed.  All starting workloads were established based on the results of the 6 minute walk test done at initial orientation visit.  The plan for exercise progression was also introduced and progression will be customized based on patient's performance and goals.          Exercise Goals and Review:   Exercise Goals     Row Name 03/11/24 [phone]             Exercise Goals   Increase Physical Activity Yes       Intervention Provide advice, education, support and counseling about physical activity/exercise needs.;Develop an individualized exercise prescription for aerobic and resistive training based on initial evaluation findings, risk stratification, comorbidities and participant's personal goals.       Expected Outcomes Short Term: Attend rehab  on a regular basis to increase amount of physical activity.;Long Term: Exercising regularly at least 3-5 days a week.;Long Term: Add in home exercise to make exercise part of routine and to increase amount of physical activity.       Increase Strength and Stamina Yes       Intervention Provide advice, education, support and counseling about physical activity/exercise needs.;Develop an individualized exercise prescription for aerobic and resistive training based on initial evaluation findings, risk stratification, comorbidities and participant's personal goals.       Expected Outcomes Short Term: Increase workloads from initial exercise prescription for resistance, speed, and METs.;Short Term: Perform resistance training exercises routinely during rehab and add in resistance training at home;Long Term: Improve cardiorespiratory fitness, muscular endurance and strength as measured by increased METs and functional capacity ( )       Able to understand and use rate of perceived exertion (RPE) scale Yes       Intervention Provide education and explanation on how to use RPE scale       Expected  Outcomes Short Term: Able to use RPE daily in rehab to express subjective intensity level;Long Term:  Able to use RPE to guide intensity level when exercising independently       Able to understand and use Dyspnea scale Yes       Intervention Provide education and explanation on how to use Dyspnea scale       Expected Outcomes Short Term: Able to use Dyspnea scale daily in rehab to express subjective sense of shortness of breath during exertion;Long Term: Able to use Dyspnea scale to guide intensity level when exercising independently       Knowledge and understanding of Target Heart Rate Range (THRR) Yes       Intervention Provide education and explanation of THRR including how the numbers were predicted and where they are located for reference       Expected Outcomes Short Term: Able to state/look up THRR;Short Term: Able to use daily as guideline for intensity in rehab;Long Term: Able to use THRR to govern intensity when exercising independently       Able to check pulse independently Yes       Intervention Provide education and demonstration on how to check pulse in carotid and radial arteries.;Review the importance of being able to check your own pulse for safety during independent exercise       Expected Outcomes Short Term: Able to explain why pulse checking is important during independent exercise;Long Term: Able to check pulse independently and accurately       Understanding of Exercise Prescription Yes       Intervention Provide education, explanation, and written materials on patient's individual exercise prescription       Expected Outcomes Short Term: Able to explain program exercise prescription;Long Term: Able to explain home exercise prescription to exercise independently          Exercise Goals Re-Evaluation :  Exercise Goals Re-Evaluation     Row Name 03/17/24 1042 04/07/24 1117 05/10/24 1129         Exercise Goal Re-Evaluation   Exercise Goals Review Able to understand and  use rate of perceived exertion (RPE) scale;Knowledge and understanding of Target Heart Rate Range (THRR) Increase Physical Activity;Increase Strength and Stamina;Able to understand and use rate of perceived exertion (RPE) scale;Able to understand and use Dyspnea scale;Knowledge and understanding of Target Heart Rate Range (THRR);Able to check pulse independently;Understanding of Exercise Prescription Increase Physical Activity;Increase Strength and  Stamina;Able to understand and use rate of perceived exertion (RPE) scale;Knowledge and understanding of Target Heart Rate Range (THRR);Understanding of Exercise Prescription     Comments Reviewed RPE and dyspnea scale, THR and program prescription with pt today.  Pt voiced understanding and was given a copy of goals to take home. Sid is doing well in rehab.  He is already walking at home and has dumbbells.  Reviewed home exercise with pt today.  Pt plans to walk at home and use hand weights too for exercise.  Reviewed THR, pulse, RPE, sign and symptoms, pulse oximetery and when to call 911 or MD.  Also discussed weather considerations and indoor options.  Pt voiced understanding.  He is starting to feel like his stamina is starting to come back. Sid is doing well in rehab. He is walking at home in the early morning's and evenings where it has been so hot. He is now on the elliptical here at rehab and that has been challenging him a little more.     Expected Outcomes Short: Use RPE daily to regulate intensity.  Long: Follow program prescription in THR. Short: Start to add in more exercise at home Long: Conitnue to exercise independently. Short: Start to add in more exercise at home. Long: Continue to increase levels on the elliptical.         Discharge Exercise Prescription (Final Exercise Prescription Changes):  Exercise Prescription Changes - 05/03/24 1500       Response to Exercise   Blood Pressure (Admit) 110/60    Blood Pressure (Exit) 100/60    Heart  Rate (Admit) 68 bpm    Heart Rate (Exercise) 109 bpm    Heart Rate (Exit) 68 bpm    Rating of Perceived Exertion (Exercise) 12    Duration Continue with 30 min of aerobic exercise without signs/symptoms of physical distress.    Intensity THRR unchanged      Progression   Progression Continue to progress workloads to maintain intensity without signs/symptoms of physical distress.      Resistance Training   Training Prescription Yes    Weight 5    Reps 10-15      Treadmill   MPH 3    Grade 3    Minutes 15    METs 4.54      REL-XR   Level 6    Speed 62    Minutes 15    METs 5.7      Home Exercise Plan   Plans to continue exercise at Home (comment)    Frequency Add 3 additional days to program exercise sessions.          Nutrition:  Target Goals: Understanding of nutrition guidelines, daily intake of sodium 1500mg , cholesterol 200mg , calories 30% from fat and 7% or less from saturated fats, daily to have 5 or more servings of fruits and vegetables.  Biometrics:  Pre Biometrics - 03/16/24 0935       Pre Biometrics   Height 5' 10.5 (1.791 m)    Weight 185 lb 14.4 oz (84.3 kg)    Waist Circumference 36 inches    Hip Circumference 39 inches    Waist to Hip Ratio 0.92 %    BMI (Calculated) 26.29    Grip Strength 29.3 kg    Single Leg Stand 30 seconds           Nutrition Therapy Plan and Nutrition Goals:  Nutrition Therapy & Goals - 03/11/24 9176  Intervention Plan   Intervention Prescribe, educate and counsel regarding individualized specific dietary modifications aiming towards targeted core components such as weight, hypertension, lipid management, diabetes, heart failure and other comorbidities.;Nutrition handout(s) given to patient.    Expected Outcomes Short Term Goal: Understand basic principles of dietary content, such as calories, fat, sodium, cholesterol and nutrients.;Long Term Goal: Adherence to prescribed nutrition plan.           Nutrition Assessments:  MEDIFICTS Score Key: >=70 Need to make dietary changes  40-70 Heart Healthy Diet <= 40 Therapeutic Level Cholesterol Diet  Flowsheet Row CARDIAC REHAB PHASE II ORIENTATION from 03/16/2024 in [facility] CARDIAC REHABILITATION  Picture Your Plate Total Score on Admission 61   Picture Your Plate Scores: <59 Unhealthy dietary pattern with much room for improvement. 41-50 Dietary pattern unlikely to meet recommendations for good health and room for improvement. 51-60 More healthful dietary pattern, with some room for improvement.  >60 Healthy dietary pattern, although there may be some specific behaviors that could be improved.    Nutrition Goals Re-Evaluation:  Nutrition Goals Re-Evaluation     Row Name 04/07/24 1134 05/10/24 1126           Goals   Nutrition Goal Heart Healthy Diet Heart Healthy Diet      Comment Sid has been working on improving his diet.  He is easing into things and making small changes here and there.  He has always watched his salt.  His biggest change has been moving away from some cereals to better/healthier options like oatmeal.  He is getting a good variety of fruits and vegetables.  His favorites are pineapple and strawberries.  His teeth will sometimes give him a hard time with leafy greens. He likes romaine and carrots for his salads.  He has been also trying to find a new salad dressings.  They have also been eating more grilled chicken and different seasonings. Sid is doing ok on his diet. He says it is about 50/50 on his choices. He is eating more chicken and fish, and adding in a romaine salad for a lot of meals.      Expected Outcome Short: Continue to try out new salad dressings.  Long: Continue to focus on healthy eating. Short: Continue to add in more veggies in his diet. Long: Continue to focus on healthy eating.         Nutrition Goals Discharge (Final Nutrition Goals Re-Evaluation):  Nutrition Goals Re-Evaluation -  05/10/24 1126       Goals   Nutrition Goal Heart Healthy Diet    Comment Sid is doing ok on his diet. He says it is about 50/50 on his choices. He is eating more chicken and fish, and adding in a romaine salad for a lot of meals.    Expected Outcome Short: Continue to add in more veggies in his diet. Long: Continue to focus on healthy eating.          Psychosocial: Target Goals: Acknowledge presence or absence of significant depression and/or stress, maximize coping skills, provide positive support system. Participant is able to verbalize types and ability to use techniques and skills needed for reducing stress and depression.  Initial Review & Psychosocial Screening:  Initial Psych Review & Screening - 03/11/24 0815       Initial Review   Current issues with Current Sleep Concerns;Current Stress Concerns    Source of Stress Concerns Unable to participate in former interests or hobbies;Unable to perform yard/household activities  Comments not a back sleeper and having a difficult time to sleep, restritctions to activity      Family Dynamics   Good Support System? Yes   wife     Barriers   Psychosocial barriers to participate in program The patient should benefit from training in stress management and relaxation.;Psychosocial barriers identified (see note)      Screening Interventions   Interventions Encouraged to exercise;To provide support and resources with identified psychosocial needs;Provide feedback about the scores to participant    Expected Outcomes Short Term goal: Utilizing psychosocial counselor, staff and physician to assist with identification of specific Stressors or current issues interfering with healing process. Setting desired goal for each stressor or current issue identified.;Long Term Goal: Stressors or current issues are controlled or eliminated.;Short Term goal: Identification and review with participant of any Quality of Life or Depression concerns found by  scoring the questionnaire.;Long Term goal: The participant improves quality of Life and PHQ9 Scores as seen by post scores and/or verbalization of changes          Quality of Life Scores:  Quality of Life - 03/16/24 0935       Quality of Life   Select Quality of Life      Quality of Life Scores   Health/Function Pre 17.2 %    Socioeconomic Pre 27.75 %    Psych/Spiritual Pre 24 %    Family Pre 24 %    GLOBAL Pre 21.94 %         Scores of 19 and below usually indicate a poorer quality of life in these areas.  A difference of  2-3 points is a clinically meaningful difference.  A difference of 2-3 points in the total score of the Quality of Life Index has been associated with significant improvement in overall quality of life, self-image, physical symptoms, and general health in studies assessing change in quality of life.  PHQ-9: Review Flowsheet       05/24/2024 04/12/2024 03/16/2024  Depression screen PHQ 2/9  Decreased Interest 0 1 1  Down, Depressed, Hopeless 1 1 1   PHQ - 2 Score 1 2 2   Altered sleeping 0 0 3  Tired, decreased energy 1 1 2   Change in appetite 1 1 2   Feeling bad or failure about yourself  1 1 1   Trouble concentrating 0 0 1  Moving slowly or fidgety/restless 1 0 1  Suicidal thoughts 0 0 0  PHQ-9 Score 5 5 12   Difficult doing work/chores Not difficult at all Somewhat difficult Somewhat difficult   Interpretation of Total Score  Total Score Depression Severity:  1-4 = Minimal depression, 5-9 = Mild depression, 10-14 = Moderate depression, 15-19 = Moderately severe depression, 20-27 = Severe depression   Psychosocial Evaluation and Intervention:  Psychosocial Evaluation - 03/11/24 0843       Psychosocial Evaluation & Interventions   Interventions Stress management education;Encouraged to exercise with the program and follow exercise prescription    Comments Sid is coming into cardiac rehab after STEMI and bypass surgery in March.  [place] his heart attack and presented at [facility] then transported to [facility] in [place]. He is still having some incisional soreness since surgery, especially on right side.  Since surgery, he has stopped his insulin after having a few episodes of 40s-60s and talking with his doctor.  He is eager to get moving again and wants to build his strength back up.  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  He is also having a hard time sleeping since surgery.  He usually only gets a few hours a night.  He has been sleeping in bed and recliner on his back but is usually a side sleeper.  He really wants to get back to sleeping.  He has tried some home remedies, but with occassional help and exhaustion to get some rest.  We talked about using a supported side sleeping position to not harm chest as he is transitioning his sleep position to try to get more rest.  He is looking forward to program and learning his new limitiations and how hard to push himself.  His wife has been his support through all this and she plans to attend orientation with him as well.  She is also on him about taking all of his medications and montioring his blood sugars.  He is now using a Dexcom and has found it very helpful in maintain his sugars.  He will only use his insulin on the rare occassions that his sugar is too high.    Expected Outcomes Short: Attend rehab to build up strength and stamina Long: Continue to cope with restrictions until lifted    Continue Psychosocial Services  Follow up required by staff          Psychosocial Re-Evaluation:  Psychosocial Re-Evaluation     Row Name 03/19/24 1210 04/07/24 1124 05/10/24 1122 05/24/24 1125       Psychosocial Re-Evaluation   Current issues with Current Sleep Concerns Current Sleep Concerns;Current  Depression Current Sleep Concerns;Current Stress Concerns Current Sleep Concerns;Current Stress Concerns    Comments Sid is off to a good start.  Continues to have issues with sleeping. He has been trying out melatonin.  We talked about some different options to try and sent him some sleep information from [facility] and on sleep hygiene to review. We also discussed trying supported side sleeping. Sid is doing well in rehab.  His still having issues sleeping as he is still on his back. He was encouraged to try to transition to his side with support.  His lack of sleep and cooler weather have him feeling depressed some days, but he feels like he is managing okay.  He also notes that he is more emotional now but aware of how he is feeling. He did review sleep stuff and has tried melatonin and found it helps some.  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 He is now two months out and starting to countdown to getting off restrictions.  He has enjoyed coming to classes and getting to meet other people and learn more. Sid is doing well in rehab. His mental status is good depending on the day. Sleep is improving some, he is sleeping on his side some but more on his back. He will be happy when he can sleep on his side all the time. He recently celebrated a birthday and he spent it at [employer]. He has a beach house so he spends a lot of time there to help him unwind and relax. Reviewed PHQ with patient. Score of 5, will re-assess in another month. Was an  outlet for patient to talk about any issues, and encouraged to continue engaging in exercise and things that bring him happiness.    Expected Outcomes Short: Review sleep info Long: Improved sleep quality. Short: Continue to work on sleep Long: Continue to stay positive Short: Continue to work on sleep. Long: Be able to sleep on side more than he is now. --    Interventions Encouraged to attend Cardiac  Rehabilitation for the exercise;Stress management education;Relaxation education Encouraged to attend Cardiac Rehabilitation for the exercise;Stress management education;Relaxation education Encouraged to attend Cardiac Rehabilitation for the exercise;Stress management education;Relaxation education Encouraged to attend Cardiac Rehabilitation for the exercise;Stress management education;Relaxation education    Continue Psychosocial Services  Follow up required by staff Follow up required by staff Follow up required by staff Follow up required by staff       Psychosocial Discharge (Final Psychosocial Re-Evaluation):  Psychosocial Re-Evaluation - 05/24/24 1125       Psychosocial Re-Evaluation   Current issues with Current Sleep Concerns;Current Stress Concerns    Comments Reviewed PHQ with patient. Score of 5, will re-assess in another month. Was an outlet for patient to talk about any issues, and encouraged to continue engaging in exercise and things that bring him happiness.    Interventions Encouraged to attend Cardiac Rehabilitation for the exercise;Stress management education;Relaxation education    Continue Psychosocial Services  Follow up required by staff          Vocational Rehabilitation: Provide vocational rehab assistance to qualifying candidates.   Vocational Rehab Evaluation & Intervention:  Vocational Rehab - 03/11/24 9188       Initial Vocational Rehab Evaluation & Intervention   Assessment shows need for Vocational Rehabilitation No   retired         Education: Education Goals: Education classes will be provided on a weekly basis, covering required topics. Participant will state understanding/return demonstration of topics presented.  Learning Barriers/Preferences:  Learning Barriers/Preferences - 03/11/24 0810       Learning Barriers/Preferences   Learning Barriers Sight   contacts and reading glasses   Learning Preferences None          Education  Topics: Hypertension, Hypertension Reduction -Define heart disease and high blood pressure. Discus how high blood pressure affects the body and ways to reduce high blood pressure.   Exercise and Your Heart -Discuss why it is important to exercise, the FITT principles of exercise, normal and abnormal responses to exercise, and how to exercise safely.   Angina -Discuss definition of angina, causes of angina, treatment of angina, and how to decrease risk of having angina.   Cardiac Medications -Review what the following cardiac medications are used for, how they affect the body, and side effects that may occur when taking the medications.  Medications include Aspirin, Beta blockers, calcium  channel blockers, ACE Inhibitors, angiotensin receptor blockers, diuretics, digoxin, and antihyperlipidemics. Flowsheet Row CARDIAC REHAB PHASE II EXERCISE from 05/05/2024 in [place] [facility]  Date 04/07/24  Educator [email or web address]  Instruction Review Code 1- Verbalizes Understanding    Congestive Heart Failure -Discuss the definition of CHF, how to live with CHF, the signs and symptoms of CHF, and how keep track of weight and sodium intake.   Heart Disease and Intimacy -Discus the effect sexual activity has on the heart, how changes occur during intimacy as we age, and safety during sexual activity.   Smoking Cessation / COPD -Discuss different methods to quit smoking, the health benefits of quitting smoking, and the definition  of COPD.   Nutrition I: Fats -Discuss the types of cholesterol, what cholesterol does to the heart, and how cholesterol levels can be controlled. Flowsheet Row CARDIAC REHAB PHASE II EXERCISE from 05/05/2024 in [place] [facility]  Date 03/17/24  Educator jh  Instruction Review Code 1- Verbalizes Understanding    Nutrition II: Labels -Discuss the different components of food labels and how to read food label   Heart Parts/Heart Disease and  PAD -Discuss the anatomy of the heart, the pathway of blood circulation through the heart, and these are affected by heart disease.   Stress I: Signs and Symptoms -Discuss the causes of stress, how stress may lead to anxiety and depression, and ways to limit stress. Flowsheet Row CARDIAC REHAB PHASE II EXERCISE from 05/05/2024 in [place]  Date 03/24/24  Educator HB  Instruction Review Code 1- Verbalizes Understanding    Stress II: Relaxation -Discuss different types of relaxation techniques to limit stress.   Warning Signs of Stroke / TIA -Discuss definition of a stroke, what the signs and symptoms are of a stroke, and how to identify when someone is having stroke.   Knowledge Questionnaire Score:  Knowledge Questionnaire Score - 03/16/24 0936       Knowledge Questionnaire Score   Pre Score 21/24          Core Components/Risk Factors/Patient Goals at Admission:  Personal Goals and Risk Factors at Admission - 03/16/24 0936       Core Components/Risk Factors/Patient Goals on Admission    Weight Management Yes;Weight Loss;Weight Maintenance    Intervention Weight Management: Develop a combined nutrition and exercise program designed to reach desired caloric intake, while maintaining appropriate intake of nutrient and fiber, sodium and fats, and appropriate energy expenditure required for the weight goal.;Weight Management: Provide education and appropriate resources to help participant work on and attain dietary goals.;Weight Management/Obesity: Establish reasonable short term and long term weight goals.    Admit Weight 185 lb 14.4 oz (84.3 kg)    Goal Weight: Short Term 180 lb (81.6 kg)    Goal Weight: Long Term 180 lb (81.6 kg)    Expected Outcomes Short Term: Continue to assess and modify interventions until short term weight is achieved;Long Term: Adherence to nutrition and physical activity/exercise program aimed toward attainment of established  weight goal;Weight Loss: Understanding of general recommendations for a balanced deficit meal plan, which promotes 1-2 lb weight loss per week and includes a negative energy balance of [phone] kcal/d;Understanding recommendations for meals to include 15-35% energy as protein, 25-35% energy from fat, 35-60% energy from carbohydrates, less than 200mg  of dietary cholesterol, 20-35 gm of total fiber daily;Understanding of distribution of calorie intake throughout the day with the consumption of 4-5 meals/snacks;Weight Maintenance: Understanding of the daily nutrition guidelines, which includes 25-35% calories from fat, 7% or less cal from saturated fats, less than 200mg  cholesterol, less than 1.5gm of sodium, & 5 or more servings of fruits and vegetables daily    Diabetes Yes    Intervention Provide education about signs/symptoms and action to take for hypo/hyperglycemia.;Provide education about proper nutrition, including hydration, and aerobic/resistive exercise prescription along with prescribed medications to achieve blood glucose in normal ranges: Fasting glucose 65-99 mg/dL    Expected Outcomes Short Term: Participant verbalizes understanding of the signs/symptoms and immediate care of hyper/hypoglycemia, proper foot care and importance of medication, aerobic/resistive exercise and nutrition plan for blood glucose control.;Long Term: Attainment of HbA1C < 7%.    Hypertension  Yes    Intervention Provide education on lifestyle modifcations including regular physical activity/exercise, weight management, moderate sodium restriction and increased consumption of fresh fruit, vegetables, and low fat dairy, alcohol moderation, and smoking cessation.;Monitor prescription use compliance.    Expected Outcomes Short Term: Continued assessment and intervention until BP is < 140/21mm HG in hypertensive participants. < 130/50mm HG in hypertensive participants with diabetes, heart failure or chronic kidney disease.;Long  Term: Maintenance of blood pressure at goal levels.    Lipids Yes    Intervention Provide education and support for participant on nutrition & aerobic/resistive exercise along with prescribed medications to achieve LDL 70mg , HDL >40mg .    Expected Outcomes Short Term: Participant states understanding of desired cholesterol values and is compliant with medications prescribed. Participant is following exercise prescription and nutrition guidelines.;Long Term: Cholesterol controlled with medications as prescribed, with individualized exercise RX and with personalized nutrition plan. Value goals: LDL < 70mg , HDL > 40 mg.          Core Components/Risk Factors/Patient Goals Review:   Goals and Risk Factor Review     Row Name 04/07/24 1129 05/10/24 1127           Core Components/Risk Factors/Patient Goals Review   Personal Goals Review Weight Management/Obesity;Hypertension;Diabetes;Lipids Hypertension;Diabetes;Lipids      Review Sid is doing well in rehab.  His weight is holding steady and trends down overall since before his heart event.  His sugars are doing well and he has restarted his insullin for 8 units at night and it has been steady. He is checking them at home.  His pressures are doing well in the 130/70s and he checks those at home as well.  He is doing well on his medicaitons. Sid is doing well in rehab. His weight is staying good and he takes all his meds as prescribed. His BP is running good about 110/60's and blood sugars are running good to around 100-120's.      Expected Outcomes Short: Continue to monitor sugars and pressure closely Long: Continue to monitor risk factors Short: Continue to check blood sugars and pressures at home. Long: Continue to monitor risk factors.         Core Components/Risk Factors/Patient Goals at Discharge (Final Review):   Goals and Risk Factor Review - 05/10/24 1127       Core Components/Risk Factors/Patient Goals Review   Personal Goals Review  Hypertension;Diabetes;Lipids    Review Sid is doing well in rehab. His weight is staying good and he takes all his meds as prescribed. His BP is running good about 110/60's and blood sugars are running good to around 100-120's.    Expected Outcomes Short: Continue to check blood sugars and pressures at home. Long: Continue to monitor risk factors.          ITP Comments:  ITP Comments     Row Name 03/11/24 0835 03/16/24 0930 03/17/24 1041 03/31/24 0955 04/28/24 0905   ITP Comments Completed virtual orientation today.  EP evaluation is scheduled for Tuesday 03/18/24 at 830 .  Documentation for diagnosis can be found in CE for [facility] 01/31/24 . Patient attend orientation today.  Patient is attending Cardiac Rehabilitation Program.  Documentation for diagnosis can be found in media and CE for [facility] encounter date 01/31/24.  Reviewed medical chart, RPE/RPD, gym safety, and program guidelines.  Patient was fitted to equipment they will be using during rehab.  Patient is scheduled to start exercise on Wednesday 03/17/24 at 1100.  Initial ITP created and sent for review and signature by Dr. Dorn Ross, Medical Director for Cardiac Rehabilitation Program. First full day of exercise!  Patient was oriented to gym and equipment including functions, settings, policies, and procedures.  Patient's individual exercise prescription and treatment plan were reviewed.  All starting workloads were established based on the results of the 6 minute walk test done at initial orientation visit.  The plan for exercise progression was also introduced and progression will be customized based on patient's performance and goals. 30 day review completed. ITP sent to Dr. Dorn Ross, Medical Director of Cardiac Rehab. Continue with ITP unless changes are made by physician.  Newer to program 30 day review completed. ITP sent to Dr. Dorn Ross, Medical Director of Cardiac Rehab. Continue with ITP unless changes  are made by physician.    Row Name 05/26/24 0843           ITP Comments 30 day review completed. ITP sent to Dr. Dorn Ross, Medical Director of Cardiac Rehab. Continue with ITP unless changes are made by physician.          Comments: 30 day review

## 2024-05-26 NOTE — Progress Notes (Signed)
 Daily Session Note  Patient Details  Name: TAYQUAN GASSMAN MRN: [id number] Date of Birth: [birth year] Referring Provider:   Flowsheet Row CARDIAC REHAB PHASE II ORIENTATION from 03/16/2024 in [facility] CARDIAC RE[facility]IL[facility]  Referring Provider Luke Sensing MD  [[place]]    Encounter Date: 05/26/2024  Check In:  Session Check In - 05/26/24 1040       Check-In   Supervising physician immediately available to respond to emergencies See telemetry face sheet for immediately available MD    Location [facility]    Staff Present Powell Benders, BS, Exercise Physiologist;Brittany Jackquline, BSN, RN, Rosalba Gelineau, MA, RCEP, CCRP, CCET;Other    Virtual Visit No    Medication changes reported     No    Fall or balance concerns reported    No    Tobacco Cessation No Change    Warm-up and Cool-down Performed on first and last piece of equipment    Resistance Training Performed Yes    VAD Patient? No    PAD/SET Patient? No      Pain Assessment   Currently in Pain? No/denies    Multiple Pain Sites No          Capillary Blood Glucose: No results found for this or any previous visit (from the past 24 hours).    Social History   Tobacco Use  Smoking Status Never  Smokeless Tobacco Never    Goals Met:  Independence with exercise equipment Exercise tolerated well No report of concerns or symptoms today Strength training completed today  Goals Unmet:  Not Applicable  Comments: Pt able to follow exercise prescription today without complaint.  Will continue to monitor for progression.

## 2024-05-28 ENCOUNTER — Encounter (HOSPITAL_COMMUNITY)
Admission: RE | Admit: 2024-05-28 | Discharge: 2024-05-28 | Disposition: A | Discharge status: Home / Self Care | Source: Ambulatory Visit

## 2024-05-28 DIAGNOSIS — Z951 Presence of aortocoronary bypass graft: Secondary | ICD-10-CM

## 2024-05-28 DIAGNOSIS — I2111 ST elevation (STEMI) myocardial infarction involving right coronary artery: Secondary | ICD-10-CM

## 2024-05-28 NOTE — Progress Notes (Signed)
 Daily Session Note  Patient Details  Name: Aaron Smith MRN: [id number] Date of Birth: [birth year] Referring Provider:   Flowsheet Row CARDIAC REHAB PHASE II ORIENTATION from 03/16/2024 in [facility] CARDIAC REHABILITATION  Referring Provider Luke Sensing MD  [[place]]    Encounter Date: 05/28/2024  Check In:  Session Check In - 05/28/24 1044       Check-In   Supervising physician immediately available to respond to emergencies See telemetry face sheet for immediately available MD    Location [facility]    Staff Present Powell Benders, BS, Exercise Physiologist;Evia Goldsmith Jackquline, BSN, RN, WTA-C    Virtual Visit No    Medication changes reported     No    Fall or balance concerns reported    No    Tobacco Cessation No Change    Warm-up and Cool-down Performed on first and last piece of equipment    Resistance Training Performed Yes    VAD Patient? No    PAD/SET Patient? No      Pain Assessment   Currently in Pain? No/denies          Capillary Blood Glucose: No results found for this or any previous visit (from the past 24 hours).    Social History   Tobacco Use  Smoking Status Never  Smokeless Tobacco Never    Goals Met:  Independence with exercise equipment Exercise tolerated well No report of concerns or symptoms today Strength training completed today  Goals Unmet:  Not Applicable  Comments: Pt able to follow exercise prescription today without complaint.  Will continue to monitor for progression.

## 2024-05-31 ENCOUNTER — Encounter (HOSPITAL_COMMUNITY)
Admission: RE | Admit: 2024-05-31 | Discharge: 2024-05-31 | Disposition: A | Discharge status: Home / Self Care | Source: Ambulatory Visit

## 2024-05-31 DIAGNOSIS — Z951 Presence of aortocoronary bypass graft: Secondary | ICD-10-CM

## 2024-05-31 DIAGNOSIS — I2111 ST elevation (STEMI) myocardial infarction involving right coronary artery: Secondary | ICD-10-CM

## 2024-05-31 NOTE — Progress Notes (Signed)
 Daily Session Note  Patient Details  Name: Aaron Smith MRN: [id number] Date of Birth: Mar 15, 1961 Referring Provider:   Flowsheet Row CARDIAC REHAB PHASE II ORIENTATION from 03/16/2024 in [facility] CARDIAC REHABILITATION  Referring Provider Luke Sensing MD  [[place]]    Encounter Date: 05/31/2024  Check In:  Session Check In - 05/31/24 1044       Check-In   Supervising physician immediately available to respond to emergencies See telemetry face sheet for immediately available MD    Location [facility]    Staff Present Powell Benders, BS, Exercise Physiologist;Dorlisa Savino Jackquline, BSN, RN, Estrella Daring, BS, RRT, CPFT    Virtual Visit No    Medication changes reported     No    Fall or balance concerns reported    No    Tobacco Cessation No Change    Warm-up and Cool-down Performed on first and last piece of equipment    Resistance Training Performed Yes    VAD Patient? No    PAD/SET Patient? No      Pain Assessment   Currently in Pain? No/denies          Capillary Blood Glucose: No results found for this or any previous visit (from the past 24 hours).    Social History   Tobacco Use  Smoking Status Never  Smokeless Tobacco Never    Goals Met:  Independence with exercise equipment Exercise tolerated well No report of concerns or symptoms today Strength training completed today  Goals Unmet:  Not Applicable  Comments: Pt able to follow exercise prescription today without complaint.  Will continue to monitor for progression.

## 2024-06-02 ENCOUNTER — Encounter (HOSPITAL_COMMUNITY)

## 2024-06-04 ENCOUNTER — Encounter (HOSPITAL_COMMUNITY)
Admission: RE | Admit: 2024-06-04 | Discharge: 2024-06-04 | Disposition: A | Discharge status: Home / Self Care | Source: Ambulatory Visit

## 2024-06-04 VITALS — Ht 70.5 in | Wt 183.2 lb

## 2024-06-04 DIAGNOSIS — I2111 ST elevation (STEMI) myocardial infarction involving right coronary artery: Secondary | ICD-10-CM | POA: Diagnosis not present

## 2024-06-04 DIAGNOSIS — Z951 Presence of aortocoronary bypass graft: Secondary | ICD-10-CM

## 2024-06-04 NOTE — Progress Notes (Signed)
 Daily Session Note  Patient Details  Name: Aaron Smith MRN: [id number] Date of Birth: [birth year] Referring Provider:   Flowsheet Row CARDIAC REHAB PHASE II ORIENTATION from 03/16/2024 in [facility] CARDIAC REHABILITATION  Referring Provider Luke Sensing MD  [[place]]    Encounter Date: 06/04/2024  Check In:  Session Check In - 06/04/24 1045       Check-In   Supervising physician immediately available to respond to emergencies See telemetry face sheet for immediately available MD    Location [facility]    Staff Present Laymon Rattler, BSN, RN, WTA-C;Heather Con, BS, Exercise Physiologist    Virtual Visit No    Medication changes reported     No    Fall or balance concerns reported    No    Tobacco Cessation No Change    Warm-up and Cool-down Performed on first and last piece of equipment    Resistance Training Performed Yes    VAD Patient? No    PAD/SET Patient? No      Pain Assessment   Currently in Pain? No/denies          Capillary Blood Glucose: No results found for this or any previous visit (from the past 24 hours).    Social History   Tobacco Use  Smoking Status Never  Smokeless Tobacco Never    Goals Met:  Independence with exercise equipment Exercise tolerated well No report of concerns or symptoms today Strength training completed today  Goals Unmet:  Not Applicable  Comments: Pt able to follow exercise prescription today without complaint.  Will continue to monitor for progression.

## 2024-06-04 NOTE — Patient Instructions (Signed)
 Discharge Patient Instructions  Patient Details  Name: Aaron Smith MRN: [id number] Date of Birth: 1961/09/28 Referring Provider:  No ref. provider found   Number of Visits: 36  Reason for Discharge:  Patient reached a stable level of exercise. Patient independent in their exercise. Patient has met program and personal goals.  Diagnosis:  ST elevation myocardial infarction involving right coronary artery (HCC)  S/P CABG x 5  Initial Exercise Prescription:  Initial Exercise Prescription - 03/16/24 0900       Date of Initial Exercise RX and Referring Provider   Date 03/16/24    Referring Provider Luke Sensing MD   [facility]     Oxygen   Maintain Oxygen Saturation 88% or higher      Treadmill   MPH 2.5    Grade 1    Minutes 15    METs 3.26      Elliptical   Level 1    Speed 50    Minutes 15    METs 3.3      REL-XR   Level 3    Speed 50    Minutes 15    METs 3.3      Prescription Details   Frequency (times per week) 3    Duration Progress to 30 minutes of continuous aerobic without signs/symptoms of physical distress      Intensity   THRR 40-80% of Max Heartrate 107-140    Ratings of Perceived Exertion 11-13    Perceived Dyspnea 0-4      Progression   Progression Continue to progress workloads to maintain intensity without signs/symptoms of physical distress.      Resistance Training   Training Prescription Yes    Weight 3 lb    Reps 10-15          Discharge Exercise Prescription (Final Exercise Prescription Changes):  Exercise Prescription Changes - 05/31/24 1300       Response to Exercise   Blood Pressure (Admit) 110/60    Blood Pressure (Exit) 92/52    Heart Rate (Admit) 95 bpm    Heart Rate (Exercise) 106 bpm    Heart Rate (Exit) 74 bpm    Rating of Perceived Exertion (Exercise) 14    Duration Continue with 30 min of aerobic exercise without signs/symptoms of physical distress.    Intensity THRR unchanged      Progression    Progression Continue to progress workloads to maintain intensity without signs/symptoms of physical distress.      Resistance Training   Training Prescription Yes    Weight 5    Reps 10-15      Treadmill   MPH 3    Grade 3    Minutes 15    METs 4.54      REL-XR   Level 3    Speed 62    Minutes 15    METs 5.1      Home Exercise Plan   Plans to continue exercise at Home (comment)    Frequency Add 3 additional days to program exercise sessions.          Functional Capacity:  6 Minute Walk     Row Name 03/16/24 0932 06/04/24 1109       6 Minute Walk   Phase Initial Discharge    Distance 1345 feet 1530 feet    Distance Feet Change -- 185 ft    Walk Time 6 minutes 6 minutes    # of Rest Breaks 0 0  MPH 2.55 2.9    METS 3.34 3.5    RPE 9 10    Perceived Dyspnea  0 --    VO2 Peak 11.68 12.41    Symptoms No No    Resting HR 73 bpm 58 bpm    Resting BP 116/58 120/60    Resting Oxygen Saturation  99 % 98 %    Exercise Oxygen Saturation  during 6 min walk 96 % 98 %    Max Ex. HR 86 bpm 70 bpm    Max Ex. BP 128/70 120/64    2 Minute Post BP 126/72 110/62      Nutrition & Weight - Outcomes:  Pre Biometrics - 03/16/24 0935       Pre Biometrics   Height 5' 10.5 (1.791 m)    Weight 84.3 kg    Waist Circumference 36 inches    Hip Circumference 39 inches    Waist to Hip Ratio 0.92 %    BMI (Calculated) 26.29    Grip Strength 29.3 kg    Single Leg Stand 30 seconds         Goals reviewed with patient; copy given to patient.

## 2024-06-07 ENCOUNTER — Encounter (HOSPITAL_COMMUNITY)
Admission: RE | Admit: 2024-06-07 | Discharge: 2024-06-07 | Disposition: A | Discharge status: Home / Self Care | Source: Ambulatory Visit

## 2024-06-07 DIAGNOSIS — Z951 Presence of aortocoronary bypass graft: Secondary | ICD-10-CM

## 2024-06-07 DIAGNOSIS — I2111 ST elevation (STEMI) myocardial infarction involving right coronary artery: Secondary | ICD-10-CM

## 2024-06-07 NOTE — Progress Notes (Signed)
 Daily Session Note  Patient Details  Name: Aaron Smith MRN: [id number] Date of Birth: 09/15/1961 Referring Provider:   Flowsheet Row CARDIAC REHAB PHASE II ORIENTATION from 03/16/2024 in [facility] CARDIAC REHABILITATION  Referring Provider Aaron Sensing MD  [[place]]    Encounter Date: 06/07/2024  Check In:  Session Check In - 06/07/24 1047       Check-In   Supervising physician immediately available to respond to emergencies See telemetry face sheet for immediately available MD    Location [facility]    Staff Present Aaron Smith, BSN, RN, WTA-C;Other   Aaron Smith   Virtual Visit No    Medication changes reported     No    Fall or balance concerns reported    No    Tobacco Cessation No Change    Warm-up and Cool-down Performed on first and last piece of equipment    Resistance Training Performed Yes    VAD Patient? No    PAD/SET Patient? No      Pain Assessment   Currently in Pain? No/denies          Capillary Blood Glucose: No results found for this or any previous visit (from the past 24 hours).    Social History   Tobacco Use  Smoking Status Never  Smokeless Tobacco Never    Goals Met:  Independence with exercise equipment Exercise tolerated well No report of concerns or symptoms today Strength training completed today  Goals Unmet:  Not Applicable  Comments: Pt able to follow exercise prescription today without complaint.  Will continue to monitor for progression.

## 2024-06-09 ENCOUNTER — Encounter (HOSPITAL_COMMUNITY)
Admission: RE | Admit: 2024-06-09 | Discharge: 2024-06-09 | Disposition: A | Discharge status: Home / Self Care | Source: Ambulatory Visit

## 2024-06-09 DIAGNOSIS — Z951 Presence of aortocoronary bypass graft: Secondary | ICD-10-CM

## 2024-06-09 DIAGNOSIS — I2111 ST elevation (STEMI) myocardial infarction involving right coronary artery: Secondary | ICD-10-CM | POA: Diagnosis not present

## 2024-06-09 NOTE — Progress Notes (Signed)
 Daily Session Note  Patient Details  Name: Aaron Smith MRN: [id number] Date of Birth: Jul 24, 1961 Referring Provider:   Flowsheet Row CARDIAC REHAB PHASE II ORIENTATION from 03/16/2024 in [facility] CARDIAC REHABILITATION  Referring Provider Luke Sensing MD  [[place]]    Encounter Date: 06/09/2024  Check In:  Session Check In - 06/09/24 1058       Check-In   Supervising physician immediately available to respond to emergencies See telemetry face sheet for immediately available MD    Location [facility]    Staff Present Laymon Rattler, BSN, RN, Rosalba Gelineau, MA, RCEP, CCRP, CCET    Virtual Visit No    Medication changes reported     No    Fall or balance concerns reported    No    Tobacco Cessation No Change    Warm-up and Cool-down Performed on first and last piece of equipment    Resistance Training Performed Yes    VAD Patient? No    PAD/SET Patient? No      Pain Assessment   Currently in Pain? No/denies          Capillary Blood Glucose: No results found for this or any previous visit (from the past 24 hours).    Social History   Tobacco Use  Smoking Status Never  Smokeless Tobacco Never    Goals Met:  Independence with exercise equipment Exercise tolerated well No report of concerns or symptoms today Strength training completed today  Goals Unmet:  Not Applicable  Comments: Pt able to follow exercise prescription today without complaint.  Will continue to monitor for progression.

## 2024-06-10 ENCOUNTER — Ambulatory Visit (INDEPENDENT_AMBULATORY_CARE_PROVIDER_SITE_OTHER): Admitting: Family

## 2024-06-10 ENCOUNTER — Telehealth: Payer: Self-pay | Admitting: Family

## 2024-06-10 ENCOUNTER — Encounter: Payer: Self-pay | Admitting: Family

## 2024-06-10 VITALS — BP 118/82 | HR 58 | Temp 97.7°F | Ht 70.5 in | Wt 181.2 lb

## 2024-06-10 DIAGNOSIS — G4700 Insomnia, unspecified: Secondary | ICD-10-CM | POA: Insufficient documentation

## 2024-06-10 DIAGNOSIS — R7309 Other abnormal glucose: Secondary | ICD-10-CM

## 2024-06-10 DIAGNOSIS — Z125 Encounter for screening for malignant neoplasm of prostate: Secondary | ICD-10-CM

## 2024-06-10 DIAGNOSIS — E119 Type 2 diabetes mellitus without complications: Secondary | ICD-10-CM | POA: Diagnosis not present

## 2024-06-10 DIAGNOSIS — I10 Essential (primary) hypertension: Secondary | ICD-10-CM | POA: Diagnosis not present

## 2024-06-10 DIAGNOSIS — I25810 Atherosclerosis of coronary artery bypass graft(s) without angina pectoris: Secondary | ICD-10-CM | POA: Diagnosis not present

## 2024-06-10 DIAGNOSIS — Z7984 Long term (current) use of oral hypoglycemic drugs: Secondary | ICD-10-CM

## 2024-06-10 DIAGNOSIS — I2510 Atherosclerotic heart disease of native coronary artery without angina pectoris: Secondary | ICD-10-CM | POA: Insufficient documentation

## 2024-06-10 DIAGNOSIS — E1369 Other specified diabetes mellitus with other specified complication: Secondary | ICD-10-CM

## 2024-06-10 LAB — POCT GLYCOSYLATED HEMOGLOBIN (HGB A1C): Hemoglobin A1C: 6.9 % — AB (ref 4.0–5.6)

## 2024-06-10 NOTE — Patient Instructions (Addendum)
 Please call me if you are taking losartan . It appears cardiology had you on this medication.    Nice meeting you.

## 2024-06-10 NOTE — Assessment & Plan Note (Signed)
 Following with cardiology [facility] 03/25/24. Reviewed follow up note.  Continue ASA 81mg , crestor  40mg  every day, and plavix 75mg  every day as managed by cardiology.

## 2024-06-10 NOTE — Telephone Encounter (Signed)
 Can we download dexcom data for patient?  A1c is excellent; I would like to transition off of insulin to an oral or non insulin injectable after reviewing dexcom

## 2024-06-10 NOTE — Assessment & Plan Note (Signed)
 Suboptimal control. Discussed anxiety, PTSD after MI. He declines adjusting medicine and prefers to rely on support system at home. Continue trazodone  150mg  at bedtime.

## 2024-06-10 NOTE — Assessment & Plan Note (Signed)
 Chronic, stable. Unclear if he is on losartan 25mg  or not; patient will confirm. Continue metoprolol 12.5mg  BID as managed by cardiology.

## 2024-06-10 NOTE — Progress Notes (Signed)
 Assessment & Plan:  Essential hypertension Assessment & Plan: Chronic, stable. Unclear if he is on losartan 25mg  or not; patient will confirm. Continue metoprolol 12.5mg  BID as managed by cardiology.  Orders: -     CBC with Differential/Platelet; Future -     Comprehensive metabolic panel with GFR; Future -     Lipid panel; Future -     Microalbumin / creatinine urine ratio; Future -     TSH; Future  Other specified diabetes mellitus with other specified complication, unspecified whether long term insulin use (HCC)  Screening for prostate cancer -     PSA; Future  Elevated glucose -     POCT glycosylated hemoglobin (Hb A1C)  Type 2 diabetes mellitus without complication, without long-term current use of insulin (HCC) Assessment & Plan: Lab Results  Component Value Date   HGBA1C 6.9 (A) 06/10/2024   Excellent control. Pending urine microalbumin. Consider SGLT2 versus GLP1 and dc lantus.  For now, continue metformin  1000mg  bid, lantus 8 units daily.    Coronary artery disease involving coronary bypass graft of native heart without angina pectoris Assessment & Plan: Following with cardiology [facility] 03/25/24. Reviewed follow up note.  Continue ASA 81mg , crestor  40mg  every day, and plavix 75mg  every day as managed by cardiology.    Insomnia, unspecified type Assessment & Plan: Suboptimal control. Discussed anxiety, PTSD after MI. He declines adjusting medicine and prefers to rely on support system at home. Continue trazodone  150mg  at bedtime.      Return precautions given.   Risks, benefits, and alternatives of the medications and treatment plan prescribed today were discussed, and patient expressed understanding.   Education regarding symptom management and diagnosis given to patient on AVS either electronically or printed.  Return in about 3 months (around 09/10/2024) for Fasting labs in 2-3 weeks.  Aaron Northern, FNP  Subjective:    Patient ID: Aaron Smith, male    DOB: [birth year], 63 y.o.   MRN: [id number]  CC: Aaron Smith is a 63 y.o. male who presents today to establish care.    HPI: Accompanied by wife  He has felt more depressed as he hasn't been as active as he is used too. He has started to mow the yard again and use his tractor which brings him joy.    He is doing cardiac rehab 3 days per week with elliptical, tread mill and some running. Endurance improving.   He has a education classes regarding  nutrition and stress which he has found helpful.   Denies SOB, CP, n, left arm numbness  He is taking trazodone  150mg  at bedtime. Some nights he has trouble falling asleep  Anxiety may affect falling asleep after health scare. He has confided in his minister.  He has the dexcom  No hypoglycemic episodes Compliant with lantus and  metformin    Denies urinary hesitancy, decreased urine stream.  Follow up cardiology [facility] 03/25/24 He went to [facility] who then brought him to [place] where he had a cardiac catheterization and then coronary artery bypass graft x 5 with Dr. Luke February 02, 2024. Patient does have hyperlipidemia and type 2 diabetes. Echocardiogram completed February 01, 2024 shows ejection fraction 55 to 60%. Patient is doing cardiac rehab back home.   Continued on aspirin 81mg , crestor  Started on lantus 8 units daily Continued on losartan and metoprolol  Allergies: Penicillins Current Outpatient Medications on File Prior to Visit  Medication Sig Dispense Refill   ASPIRIN LOW  DOSE 81 MG tablet Take 81 mg by mouth every morning.     Blood Glucose Monitoring Suppl (ACCU-CHEK GUIDE ME) w/Device KIT 1 each by Does not apply route.     clopidogrel (PLAVIX) 75 MG tablet Take 75 mg by mouth daily.     Continuous Glucose Sensor (DEXCOM G7 SENSOR) MISC 1 each by Does not apply route.     insulin glargine (LANTUS) 100 UNIT/ML injection Inject 8 Units into the skin daily.     metFORMIN  (GLUCOPHAGE ) 1000  MG tablet Take 1 tablet (1,000 mg total) by mouth 2 (two) times daily with a meal. 180 tablet 0   metoprolol tartrate (LOPRESSOR) 25 MG tablet Take 12.5 mg by mouth 2 (two) times daily.     rosuvastatin  (CRESTOR ) 40 MG tablet Take 1 tablet (40 mg total) by mouth daily. 90 tablet 0   sildenafil  (REVATIO ) 20 MG tablet Take 1-3 tabs po prn x1 for sex 90 tablet 1   traZODone  (DESYREL ) 150 MG tablet Take 1 tablet (150 mg total) by mouth at bedtime. 15 tablet 2   [DISCONTINUED] sildenafil  (REVATIO ) 20 MG tablet Take 1-3 tabs po prn x1 for sex 90 tablet 1   No current facility-administered medications on file prior to visit.    Review of Systems  Constitutional:  Negative for chills and fever.  Respiratory:  Negative for cough and shortness of breath.   Cardiovascular:  Negative for chest pain, palpitations and leg swelling.  Gastrointestinal:  Negative for nausea and vomiting.      Objective:    BP 118/82   Pulse (!) 58   Temp 97.7 F (36.5 C) (Oral)   Ht 5' 10.5 (1.791 m)   Wt 181 lb 3.2 oz (82.2 kg)   SpO2 97%   BMI 25.63 kg/m  BP Readings from Last 3 Encounters:  06/10/24 118/82  08/27/23 122/72  02/24/23 (!) 160/108   Wt Readings from Last 3 Encounters:  06/10/24 181 lb 3.2 oz (82.2 kg)  06/04/24 183 lb 3.2 oz (83.1 kg)  03/16/24 185 lb 14.4 oz (84.3 kg)      06/10/2024   11:08 AM 05/24/2024   11:24 AM 04/12/2024   11:06 AM  Depression screen PHQ 2/9  Decreased Interest 0 0 1  Down, Depressed, Hopeless 0 1 1  PHQ - 2 Score 0 1 2  Altered sleeping 0 0 0  Tired, decreased energy 0 1 1  Change in appetite 0 1 1  Feeling bad or failure about yourself  0 1 1  Trouble concentrating 0 0 0  Moving slowly or fidgety/restless 0 1 0  Suicidal thoughts 0 0 0  PHQ-9 Score 0 5 5  Difficult doing work/chores Not difficult at all Not difficult at all Somewhat difficult      06/10/2024   11:08 AM  GAD 7 : Generalized Anxiety Score  Nervous, Anxious, on Edge 0  Control/stop  worrying 0  Worry too much - different things 0  Trouble relaxing 0  Restless 0  Easily annoyed or irritable 0  Afraid - awful might happen 0  Total GAD 7 Score 0  Anxiety Difficulty Not difficult at all     Physical Exam Vitals reviewed.  Constitutional:      Appearance: He is well-developed.  Cardiovascular:     Rate and Rhythm: Regular rhythm.     Heart sounds: Normal heart sounds.  Pulmonary:     Effort: Pulmonary effort is normal. No respiratory distress.  Breath sounds: Normal breath sounds. No wheezing, rhonchi or rales.  Musculoskeletal:     Right lower leg: No edema.     Left lower leg: No edema.  Skin:    General: Skin is warm and dry.  Neurological:     Mental Status: He is alert.  Psychiatric:        Speech: Speech normal.        Behavior: Behavior normal.

## 2024-06-10 NOTE — Assessment & Plan Note (Addendum)
 Lab Results  Component Value Date   HGBA1C 6.9 (A) 06/10/2024   Excellent control. Pending urine microalbumin. Consider SGLT2 versus GLP1 and dc lantus.  For now, continue metformin  1000mg  bid, lantus 8 units daily.

## 2024-06-11 ENCOUNTER — Encounter (HOSPITAL_COMMUNITY)
Admission: RE | Admit: 2024-06-11 | Discharge: 2024-06-11 | Disposition: A | Discharge status: Home / Self Care | Source: Ambulatory Visit

## 2024-06-11 DIAGNOSIS — I2111 ST elevation (STEMI) myocardial infarction involving right coronary artery: Secondary | ICD-10-CM | POA: Diagnosis present

## 2024-06-11 DIAGNOSIS — Z951 Presence of aortocoronary bypass graft: Secondary | ICD-10-CM | POA: Insufficient documentation

## 2024-06-11 NOTE — Progress Notes (Signed)
 Daily Session Note  Patient Details  Name: Aaron Smith MRN: [id number] Date of Birth: 27-Jun-1961 Referring Provider:   Flowsheet Row CARDIAC REHAB PHASE II ORIENTATION from 03/16/2024 in [facility] CARDIAC REHABILITATION  Referring Provider Luke Sensing MD  [[place]]    Encounter Date: 06/11/2024  Check In:  Session Check In - 06/11/24 1045       Check-In   Supervising physician immediately available to respond to emergencies See telemetry face sheet for immediately available MD    Location [facility]    Staff Present Adrien Louder, RN, BSN;Jessica Sabattus, MA, RCEP, CCRP, CCET;Laureen [place], BS, RRT, CPFT    Virtual Visit No    Medication changes reported     No    Fall or balance concerns reported    No    Warm-up and Cool-down Performed on first and last piece of equipment    Resistance Training Performed Yes    VAD Patient? No    PAD/SET Patient? No      Pain Assessment   Currently in Pain? No/denies    Multiple Pain Sites No          Capillary Blood Glucose: Results for orders placed or performed in visit on 06/10/24 (from the past 24 hours)  POCT HgB A1C     Status: Abnormal   Collection Time: 06/10/24 12:13 PM  Result Value Ref Range   Hemoglobin A1C 6.9 (A) 4.0 - 5.6 %   HbA1c POC (<> result, manual entry)     HbA1c, POC (prediabetic range)     HbA1c, POC (controlled diabetic range)        Social History   Tobacco Use  Smoking Status Never  Smokeless Tobacco Never    Goals Met:  Independence with exercise equipment Exercise tolerated well No report of concerns or symptoms today Strength training completed today  Goals Unmet:  Not Applicable  Comments: Pt able to follow exercise prescription today without complaint.  Will continue to monitor for progression.

## 2024-06-14 ENCOUNTER — Encounter (HOSPITAL_COMMUNITY)
Admission: RE | Admit: 2024-06-14 | Discharge: 2024-06-14 | Disposition: A | Discharge status: Home / Self Care | Source: Ambulatory Visit

## 2024-06-14 DIAGNOSIS — I2111 ST elevation (STEMI) myocardial infarction involving right coronary artery: Secondary | ICD-10-CM

## 2024-06-14 DIAGNOSIS — Z951 Presence of aortocoronary bypass graft: Secondary | ICD-10-CM

## 2024-06-14 NOTE — Progress Notes (Signed)
 Daily Session Note  Patient Details  Name: Aaron Smith MRN: [id number] Date of Birth: 07-11-1961 Referring Provider:   Flowsheet Row CARDIAC REHAB PHASE II ORIENTATION from 03/16/2024 in [facility] CARDIAC REHABILITATION  Referring Provider Luke Sensing MD  [[place]]    Encounter Date: 06/14/2024  Check In:  Session Check In - 06/14/24 1045       Check-In   Supervising physician immediately available to respond to emergencies See telemetry face sheet for immediately available MD    Location [facility]    Staff Present Adrien Louder, RN, BSN;Heather Con, BS, Exercise Physiologist;Brittany Jackquline, BSN, RN, WTA-C    Virtual Visit No    Medication changes reported     No    Fall or balance concerns reported    No    Warm-up and Cool-down Performed on first and last piece of equipment    Resistance Training Performed Yes    VAD Patient? No    PAD/SET Patient? No      Pain Assessment   Currently in Pain? No/denies    Multiple Pain Sites No          Capillary Blood Glucose: No results found for this or any previous visit (from the past 24 hours).    Social History   Tobacco Use  Smoking Status Never  Smokeless Tobacco Never    Goals Met:  Independence with exercise equipment Exercise tolerated well No report of concerns or symptoms today Strength training completed today  Goals Unmet:  Not Applicable  Comments: Pt able to follow exercise prescription today without complaint.  Will continue to monitor for progression.

## 2024-06-16 ENCOUNTER — Encounter (HOSPITAL_COMMUNITY)
Admission: RE | Admit: 2024-06-16 | Discharge: 2024-06-16 | Disposition: A | Discharge status: Home / Self Care | Source: Ambulatory Visit

## 2024-06-16 DIAGNOSIS — I2111 ST elevation (STEMI) myocardial infarction involving right coronary artery: Secondary | ICD-10-CM | POA: Diagnosis not present

## 2024-06-16 DIAGNOSIS — Z951 Presence of aortocoronary bypass graft: Secondary | ICD-10-CM

## 2024-06-16 NOTE — Progress Notes (Signed)
 Discharge Progress Report  Patient Details  Name: Aaron Smith MRN: [id number] Date of Birth: Nov 18, 1960 Referring Provider:   Flowsheet Row CARDIAC REHAB PHASE II ORIENTATION from 03/16/2024 in [facility] CARDIAC REHABILITATION  Referring Provider Luke Sensing MD  [[place]]     Number of Visits: 36  Reason for Discharge:  Patient reached a stable level of exercise. Patient independent in their exercise. Patient has met program and personal goals.  Diagnosis:  ST elevation myocardial infarction involving right coronary artery (HCC)  S/P CABG x 5  ADL UCSD:   Initial Exercise Prescription:  Initial Exercise Prescription - 03/16/24 0900       Date of Initial Exercise RX and Referring Provider   Date 03/16/24    Referring Provider Luke Sensing MD   [facility]     Oxygen   Maintain Oxygen Saturation 88% or higher      Treadmill   MPH 2.5    Grade 1    Minutes 15    METs 3.26      Elliptical   Level 1    Speed 50    Minutes 15    METs 3.3      REL-XR   Level 3    Speed 50    Minutes 15    METs 3.3      Prescription Details   Frequency (times per week) 3    Duration Progress to 30 minutes of continuous aerobic without signs/symptoms of physical distress      Intensity   THRR 40-80% of Max Heartrate 107-140    Ratings of Perceived Exertion 11-13    Perceived Dyspnea 0-4      Progression   Progression Continue to progress workloads to maintain intensity without signs/symptoms of physical distress.      Resistance Training   Training Prescription Yes    Weight 3 lb    Reps 10-15          Discharge Exercise Prescription (Final Exercise Prescription Changes):  Exercise Prescription Changes - 05/31/24 1300       Response to Exercise   Blood Pressure (Admit) 110/60    Blood Pressure (Exit) 92/52    Heart Rate (Admit) 95 bpm    Heart Rate (Exercise) 106 bpm    Heart Rate (Exit) 74 bpm    Rating of Perceived Exertion (Exercise) 14     Duration Continue with 30 min of aerobic exercise without signs/symptoms of physical distress.    Intensity THRR unchanged      Progression   Progression Continue to progress workloads to maintain intensity without signs/symptoms of physical distress.      Resistance Training   Training Prescription Yes    Weight 5    Reps 10-15      Treadmill   MPH 3    Grade 3    Minutes 15    METs 4.54      REL-XR   Level 3    Speed 62    Minutes 15    METs 5.1      Home Exercise Plan   Plans to continue exercise at Home (comment)    Frequency Add 3 additional days to program exercise sessions.          Functional Capacity:  6 Minute Walk     Row Name 03/16/24 0932 06/04/24 1109       6 Minute Walk   Phase Initial Discharge    Distance 1345 feet 1530 feet  Distance Feet Change -- 185 ft    Walk Time 6 minutes 6 minutes    # of Rest Breaks 0 0    MPH 2.55 2.9    METS 3.34 3.5    RPE 9 10    Perceived Dyspnea  0 --    VO2 Peak 11.68 12.41    Symptoms No No    Resting HR 73 bpm 58 bpm    Resting BP 116/58 120/60    Resting Oxygen Saturation  99 % 98 %    Exercise Oxygen Saturation  during 6 min walk 96 % 98 %    Max Ex. HR 86 bpm 70 bpm    Max Ex. BP 128/70 120/64    2 Minute Post BP 126/72 110/62       Psychological, QOL, Others - Outcomes: PHQ 2/9:    06/14/2024    3:33 PM 06/10/2024   11:08 AM 05/24/2024   11:24 AM 04/12/2024   11:06 AM 03/16/2024    9:37 AM  Depression screen PHQ 2/9  Decreased Interest 0 0 0 1 1  Down, Depressed, Hopeless 1 0 1 1 1   PHQ - 2 Score 1 0 1 2 2   Altered sleeping 1 0 0 0 3  Tired, decreased energy 1 0 1 1 2   Change in appetite 1 0 1 1 2   Feeling bad or failure about yourself  1 0 1 1 1   Trouble concentrating 0 0 0 0 1  Moving slowly or fidgety/restless 1 0 1 0 1  Suicidal thoughts 0 0 0 0 0  PHQ-9 Score 6 0 5 5 12   Difficult doing work/chores Somewhat difficult Not difficult at all Not difficult at all Somewhat difficult  Somewhat difficult    Quality of Life:  Quality of Life - 06/14/24 1533       Quality of Life   Select Quality of Life      Quality of Life Scores   Health/Function Pre 17.2 %    Health/Function Post 20.8 %    Health/Function % Change 20.93 %    Socioeconomic Pre 27.75 %    Socioeconomic Post 26.44 %    Socioeconomic % Change  -4.72 %    Psych/Spiritual Pre 24 %    Psych/Spiritual Post 23.14 %    Psych/Spiritual % Change -3.58 %    Family Pre 24 %    Family Post 30 %    Family % Change 25 %    GLOBAL Pre 21.94 %    GLOBAL Post 23.87 %    GLOBAL % Change 8.8 %         Nutrition & Weight - Outcomes:  Pre Biometrics - 03/16/24 0935       Pre Biometrics   Height 5' 10.5 (1.791 m)    Weight 84.3 kg    Waist Circumference 36 inches    Hip Circumference 39 inches    Waist to Hip Ratio 0.92 %    BMI (Calculated) 26.29    Grip Strength 29.3 kg    Single Leg Stand 30 seconds          Post Biometrics - 06/04/24 1111        Post  Biometrics   Height 5' 10.5 (1.791 m)    Weight 83.1 kg    Waist Circumference 36 inches    Hip Circumference 38 inches    Waist to Hip Ratio 0.95 %    BMI (Calculated) 25.91    Grip  Strength 29.3 kg    Single Leg Stand 30 seconds         Education Questionnaire Score:  Knowledge Questionnaire Score - 06/14/24 1535       Knowledge Questionnaire Score   Post Score 21/24          Goals reviewed with patient; copy given to patient.

## 2024-06-16 NOTE — Progress Notes (Addendum)
 Daily Session Note  Patient Details  Name: Aaron Smith MRN: [id number] Date of Birth: 09/04/1961 Referring Provider:   Flowsheet Row CARDIAC REHAB PHASE II ORIENTATION from 03/16/2024 in [facility] CARDIAC REHABILITATION  Referring Provider Aaron Sensing MD  [[place]]    Encounter Date: 06/16/2024  Check In:  Session Check In - 06/16/24 1037       Check-In   Supervising physician immediately available to respond to emergencies See telemetry face sheet for immediately available MD    Location [facility]    Staff Present Aaron Smith, BSN, RN, WTA-C;Aaron Smith, BS, Exercise Physiologist;Aaron Vonzell, MA, RCEP, CCRP, CCET    Virtual Visit No    Medication changes reported     No    Fall or balance concerns reported    No    Tobacco Cessation No Change    Warm-up and Cool-down Performed on first and last piece of equipment    Resistance Training Performed Yes    VAD Patient? No    PAD/SET Patient? No      Pain Assessment   Currently in Pain? No/denies          Capillary Blood Glucose: No results found for this or any previous visit (from the past 24 hours).    Social History   Tobacco Use  Smoking Status Never  Smokeless Tobacco Never    Goals Met:  Independence with exercise equipment Exercise tolerated well No report of concerns or symptoms today Strength training completed today  Goals Unmet:  Not Applicable  Comments:  Aaron Smith graduated today from  rehab with 36 sessions completed.  Details of the patient's exercise prescription and what He needs to do in order to continue the prescription and progress were discussed with patient.  Patient was given a copy of prescription and goals.  Patient verbalized understanding. Aaron Smith plans to continue to exercise by [place], and he is thinking about joining maintenance.

## 2024-06-16 NOTE — Progress Notes (Signed)
 Cardiac Individual Treatment Plan  Patient Details  Name: Aaron Smith MRN: [id number] Date of Birth: 02/07/1961 Referring Provider:   Flowsheet Row CARDIAC REHAB PHASE II ORIENTATION from 03/16/2024 in [facility] CARDIAC REHABILITATION  Referring Provider Luke Sensing MD  [[place]]    Initial Encounter Date:  Flowsheet Row CARDIAC REHAB PHASE II ORIENTATION from 03/16/2024 in [place] CARDIAC REHABILITATION  Date 03/16/24    Visit Diagnosis: ST elevation myocardial infarction involving right coronary artery (HCC)  S/P CABG x 5  Patient's Home Medications on Admission:  Current Outpatient Medications:    ASPIRIN LOW DOSE 81 MG tablet, Take 81 mg by mouth every morning., Disp: , Rfl:    Blood Glucose Monitoring Suppl (ACCU-CHEK GUIDE ME) w/Device KIT, 1 each by Does not apply route., Disp: , Rfl:    clopidogrel (PLAVIX) 75 MG tablet, Take 75 mg by mouth daily., Disp: , Rfl:    Continuous Glucose Sensor (DEXCOM G7 SENSOR) MISC, 1 each by Does not apply route., Disp: , Rfl:    insulin glargine (LANTUS) 100 UNIT/ML injection, Inject 8 Units into the skin daily., Disp: , Rfl:    metFORMIN  (GLUCOPHAGE ) 1000 MG tablet, Take 1 tablet (1,000 mg total) by mouth 2 (two) times daily with a meal., Disp: 180 tablet, Rfl: 0   metoprolol tartrate (LOPRESSOR) 25 MG tablet, Take 12.5 mg by mouth 2 (two) times daily., Disp: , Rfl:    rosuvastatin  (CRESTOR ) 40 MG tablet, Take 1 tablet (40 mg total) by mouth daily., Disp: 90 tablet, Rfl: 0   sildenafil  (REVATIO ) 20 MG tablet, Take 1-3 tabs po prn x1 for sex, Disp: 90 tablet, Rfl: 1   traZODone  (DESYREL ) 150 MG tablet, Take 1 tablet (150 mg total) by mouth at bedtime., Disp: 15 tablet, Rfl: 2  Past Medical History: Past Medical History:  Diagnosis Date   Diabetes mellitus without complication (HCC)    Hypertension     Tobacco Use: Social History   Tobacco Use  Smoking Status Never  Smokeless Tobacco Never    Labs: Review Flowsheet   More data exists      Latest Ref Rng & Units 08/21/2021 12/26/2021 02/17/2023 08/27/2023 06/10/2024  Labs for ITP Cardiac and Pulmonary Rehab  Cholestrol 100 - 199 mg/dL 850  - 828  787  -  LDL (calc) 0 - 99 mg/dL 83  - 93  874  -  HDL-C >39 mg/dL 33  - 37  31  -  Trlycerides 0 - 149 mg/dL 803  - 760  682  -  Hemoglobin A1c 4.0 - 5.6 % 9.4  7.3  10.0  - 6.9     Capillary Blood Glucose: Lab Results  Component Value Date   GLUCAP 146 (H) 03/19/2024   GLUCAP 153 (H) 03/17/2024   GLUCAP 209 (H) 03/17/2024   GLUCAP 170 (H) 10/07/2018   GLUCAP 145 (H) 10/07/2018     Exercise Target Goals: Exercise Program Goal: Individual exercise prescription set using results from initial 6 min walk test and THRR while considering  patient's activity barriers and safety.   Exercise Prescription Goal: Starting with aerobic activity 30 plus minutes a day, 3 days per week for initial exercise prescription. Provide home exercise prescription and guidelines that participant acknowledges understanding prior to discharge.  Activity Barriers & Risk Stratification:  Activity Barriers & Cardiac Risk Stratification - 03/16/24 0932       Activity Barriers & Cardiac Risk Stratification   Activity Barriers Incisional Pain;Deconditioning    Cardiac  Risk Stratification High          6 Minute Walk:  6 Minute Walk     Row Name 03/16/24 0932 06/04/24 1109       6 Minute Walk   Phase Initial Discharge    Distance 1345 feet 1530 feet    Distance Feet Change -- 185 ft    Walk Time 6 minutes 6 minutes    # of Rest Breaks 0 0    MPH 2.55 2.9    METS 3.34 3.5    RPE 9 10    Perceived Dyspnea  0 --    VO2 Peak 11.68 12.41    Symptoms No No    Resting HR 73 bpm 58 bpm    Resting BP 116/58 120/60    Resting Oxygen Saturation  99 % 98 %    Exercise Oxygen Saturation  during 6 min walk 96 % 98 %    Max Ex. HR 86 bpm 70 bpm    Max Ex. BP 128/70 120/64    2 Minute Post BP 126/72 110/62       Oxygen  Initial Assessment:   Oxygen Re-Evaluation:   Oxygen Discharge (Final Oxygen Re-Evaluation):   Initial Exercise Prescription:  Initial Exercise Prescription - 03/16/24 0900       Date of Initial Exercise RX and Referring Provider   Date 03/16/24    Referring Provider Luke Sensing MD   [facility]     Oxygen   Maintain Oxygen Saturation 88% or higher      Treadmill   MPH 2.5    Grade 1    Minutes 15    METs 3.26      Elliptical   Level 1    Speed 50    Minutes 15    METs 3.3      REL-XR   Level 3    Speed 50    Minutes 15    METs 3.3      Prescription Details   Frequency (times per week) 3    Duration Progress to 30 minutes of continuous aerobic without signs/symptoms of physical distress      Intensity   THRR 40-80% of Max Heartrate 107-140    Ratings of Perceived Exertion 11-13    Perceived Dyspnea 0-4      Progression   Progression Continue to progress workloads to maintain intensity without signs/symptoms of physical distress.      Resistance Training   Training Prescription Yes    Weight 3 lb    Reps 10-15          Perform Capillary Blood Glucose checks as needed.  Exercise Prescription Changes:   Exercise Prescription Changes     Row Name 03/16/24 0900 04/07/24 1100 04/12/24 1500 05/03/24 1500 05/31/24 1300     Response to Exercise   Blood Pressure (Admit) 116/58 -- 110/72 110/60 110/60   Blood Pressure (Exercise) 128/70 -- -- -- --   Blood Pressure (Exit) 126/72 -- 112/60 100/60 92/52   Heart Rate (Admit) 73 bpm -- 76 bpm 68 bpm 95 bpm   Heart Rate (Exercise) 86 bpm -- 114 bpm 109 bpm 106 bpm   Heart Rate (Exit) 73 bpm -- 101 bpm 68 bpm 74 bpm   Oxygen Saturation (Admit) 99 % -- -- -- --   Oxygen Saturation (Exercise) 96 % -- -- -- --   Rating of Perceived Exertion (Exercise) 9 -- 13 12 14    Perceived Dyspnea (Exercise) 0 -- -- -- --  Symptoms none -- -- -- --   Comments walk test results -- -- -- --   Duration -- -- Continue  with 30 min of aerobic exercise without signs/symptoms of physical distress. Continue with 30 min of aerobic exercise without signs/symptoms of physical distress. Continue with 30 min of aerobic exercise without signs/symptoms of physical distress.   Intensity -- -- THRR unchanged THRR unchanged THRR unchanged     Progression   Progression -- -- Continue to progress workloads to maintain intensity without signs/symptoms of physical distress. Continue to progress workloads to maintain intensity without signs/symptoms of physical distress. Continue to progress workloads to maintain intensity without signs/symptoms of physical distress.     Resistance Training   Training Prescription -- -- Yes Yes Yes   Weight -- -- 5 5 5    Reps -- -- 10-15 10-15 10-15     Treadmill   MPH -- -- 2.7 3 3    Grade -- -- 3.5 3 3    Minutes -- -- 15 15 15    METs -- -- 4.37 4.54 4.54     REL-XR   Level -- -- 6 6 3    Speed -- -- 58 62 62   Minutes -- -- 15 15 15    METs -- -- 4.2 5.7 5.1     Home Exercise Plan   Plans to continue exercise at -- Home (comment)  walking, weights Home (comment) Home (comment) Home (comment)   Frequency -- Add 3 additional days to program exercise sessions. Add 3 additional days to program exercise sessions. Add 3 additional days to program exercise sessions. Add 3 additional days to program exercise sessions.   Initial Home Exercises Provided -- 04/07/24 -- -- --      Exercise Comments:   Exercise Comments     Row Name 03/17/24 1041           Exercise Comments First full day of exercise!  Patient was oriented to gym and equipment including functions, settings, policies, and procedures.  Patient's individual exercise prescription and treatment plan were reviewed.  All starting workloads were established based on the results of the 6 minute walk test done at initial orientation visit.  The plan for exercise progression was also introduced and progression will be customized based  on patient's performance and goals.          Exercise Goals and Review:   Exercise Goals     Row Name 03/11/24 [phone]             Exercise Goals   Increase Physical Activity Yes       Intervention Provide advice, education, support and counseling about physical activity/exercise needs.;Develop an individualized exercise prescription for aerobic and resistive training based on initial evaluation findings, risk stratification, comorbidities and participant's personal goals.       Expected Outcomes Short Term: Attend rehab on a regular basis to increase amount of physical activity.;Long Term: Exercising regularly at least 3-5 days a week.;Long Term: Add in home exercise to make exercise part of routine and to increase amount of physical activity.       Increase Strength and Stamina Yes       Intervention Provide advice, education, support and counseling about physical activity/exercise needs.;Develop an individualized exercise prescription for aerobic and resistive training based on initial evaluation findings, risk stratification, comorbidities and participant's personal goals.       Expected Outcomes Short Term: Increase workloads from initial exercise prescription for resistance, speed, and METs.;Short Term: Perform resistance  training exercises routinely during rehab and add in resistance training at home;Long Term: Improve cardiorespiratory fitness, muscular endurance and strength as measured by increased METs and functional capacity ( )       Able to understand and use rate of perceived exertion (RPE) scale Yes       Intervention Provide education and explanation on how to use RPE scale       Expected Outcomes Short Term: Able to use RPE daily in rehab to express subjective intensity level;Long Term:  Able to use RPE to guide intensity level when exercising independently       Able to understand and use Dyspnea scale Yes       Intervention Provide education and explanation on how to use  Dyspnea scale       Expected Outcomes Short Term: Able to use Dyspnea scale daily in rehab to express subjective sense of shortness of breath during exertion;Long Term: Able to use Dyspnea scale to guide intensity level when exercising independently       Knowledge and understanding of Target Heart Rate Range (THRR) Yes       Intervention Provide education and explanation of THRR including how the numbers were predicted and where they are located for reference       Expected Outcomes Short Term: Able to state/look up THRR;Short Term: Able to use daily as guideline for intensity in rehab;Long Term: Able to use THRR to govern intensity when exercising independently       Able to check pulse independently Yes       Intervention Provide education and demonstration on how to check pulse in carotid and radial arteries.;Review the importance of being able to check your own pulse for safety during independent exercise       Expected Outcomes Short Term: Able to explain why pulse checking is important during independent exercise;Long Term: Able to check pulse independently and accurately       Understanding of Exercise Prescription Yes       Intervention Provide education, explanation, and written materials on patient's individual exercise prescription       Expected Outcomes Short Term: Able to explain program exercise prescription;Long Term: Able to explain home exercise prescription to exercise independently          Exercise Goals Re-Evaluation :  Exercise Goals Re-Evaluation     Row Name 03/17/24 1042 04/07/24 1117 05/10/24 1129 05/28/24 1106       Exercise Goal Re-Evaluation   Exercise Goals Review Able to understand and use rate of perceived exertion (RPE) scale;Knowledge and understanding of Target Heart Rate Range (THRR) Increase Physical Activity;Increase Strength and Stamina;Able to understand and use rate of perceived exertion (RPE) scale;Able to understand and use Dyspnea scale;Knowledge and  understanding of Target Heart Rate Range (THRR);Able to check pulse independently;Understanding of Exercise Prescription Increase Physical Activity;Increase Strength and Stamina;Able to understand and use rate of perceived exertion (RPE) scale;Knowledge and understanding of Target Heart Rate Range (THRR);Understanding of Exercise Prescription Increase Physical Activity;Increase Strength and Stamina;Knowledge and understanding of Target Heart Rate Range (THRR);Able to check pulse independently    Comments Reviewed RPE and dyspnea scale, THR and program prescription with pt today.  Pt voiced understanding and was given a copy of goals to take home. Sid is doing well in rehab.  He is already walking at home and has dumbbells.  Reviewed home exercise with pt today.  Pt plans to walk at home and use hand weights too for exercise.  Reviewed THR,  pulse, RPE, sign and symptoms, pulse oximetery and when to call 911 or MD.  Also discussed weather considerations and indoor options.  Pt voiced understanding.  He is starting to feel like his stamina is starting to come back. Sid is doing well in rehab. He is walking at home in the early morning's and evenings where it has been so hot. He is now on the elliptical here at rehab and that has been challenging him a little more. Sid is doing well in rehab. 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 He is on the treadmill, XR and elliptical here at rehab.    Expected Outcomes Short: Use RPE daily to regulate intensity.  Long: Follow program prescription in THR. Short: Start to add in more exercise at home Long: Conitnue to exercise independently. Short: Start to add in more exercise at home. Long: Continue to increase levels on the elliptical. Short: Start to add in more exercise at home. Long: Continue to increase levels on his machines here at rehab.        Discharge Exercise Prescription (Final Exercise Prescription Changes):  Exercise  Prescription Changes - 05/31/24 1300       Response to Exercise   Blood Pressure (Admit) 110/60    Blood Pressure (Exit) 92/52    Heart Rate (Admit) 95 bpm    Heart Rate (Exercise) 106 bpm    Heart Rate (Exit) 74 bpm    Rating of Perceived Exertion (Exercise) 14    Duration Continue with 30 min of aerobic exercise without signs/symptoms of physical distress.    Intensity THRR unchanged      Progression   Progression Continue to progress workloads to maintain intensity without signs/symptoms of physical distress.      Resistance Training   Training Prescription Yes    Weight 5    Reps 10-15      Treadmill   MPH 3    Grade 3    Minutes 15    METs 4.54      REL-XR   Level 3    Speed 62    Minutes 15    METs 5.1      Home Exercise Plan   Plans to continue exercise at Home (comment)    Frequency Add 3 additional days to program exercise sessions.          Nutrition:  Target Goals: Understanding of nutrition guidelines, daily intake of sodium 1500mg , cholesterol 200mg , calories 30% from fat and 7% or less from saturated fats, daily to have 5 or more servings of fruits and vegetables.  Biometrics:  Pre Biometrics - 03/16/24 0935       Pre Biometrics   Height 5' 10.5 (1.791 m)    Weight 84.3 kg    Waist Circumference 36 inches    Hip Circumference 39 inches    Waist to Hip Ratio 0.92 %    BMI (Calculated) 26.29    Grip Strength 29.3 kg    Single Leg Stand 30 seconds          Post Biometrics - 06/04/24 1111        Post  Biometrics   Height 5' 10.5 (1.791 m)    Weight 83.1 kg    Waist Circumference 36 inches    Hip Circumference 38 inches    Waist to Hip Ratio 0.95 %    BMI (Calculated) 25.91    Grip Strength 29.3 kg  Single Leg Stand 30 seconds          Nutrition Therapy Plan and Nutrition Goals:  Nutrition Therapy & Goals - 03/11/24 0823       Intervention Plan   Intervention Prescribe, educate and counsel regarding individualized  specific dietary modifications aiming towards targeted core components such as weight, hypertension, lipid management, diabetes, heart failure and other comorbidities.;Nutrition handout(s) given to patient.    Expected Outcomes Short Term Goal: Understand basic principles of dietary content, such as calories, fat, sodium, cholesterol and nutrients.;Long Term Goal: Adherence to prescribed nutrition plan.          Nutrition Assessments:  MEDIFICTS Score Key: >=70 Need to make dietary changes  40-70 Heart Healthy Diet <= 40 Therapeutic Level Cholesterol Diet  Flowsheet Row CARDIAC REHAB PHASE II EXERCISE from 06/14/2024 in [facility] CARDIAC [facility]ABILITATION  Picture Your Plate Total Score on Admission 61  Picture Your Plate Total Score on Discharge 70   Picture Your Plate Scores: <59 Unhealthy dietary pattern with much room for improvement. 41-50 Dietary pattern unlikely to meet recommendations for good health and room for improvement. 51-60 More healthful dietary pattern, with some room for improvement.  >60 Healthy dietary pattern, although there may be some specific behaviors that could be improved.    Nutrition Goals Re-Evaluation:  Nutrition Goals Re-Evaluation     Row Name 04/07/24 1134 05/10/24 1126 05/28/24 1104         Goals   Nutrition Goal Heart Healthy Diet Heart Healthy Diet Heart Healthy Diet     Comment Sid has been working on improving his diet.  He is easing into things and making small changes here and there.  He has always watched his salt.  His biggest change has been moving away from some cereals to better/healthier options like oatmeal.  He is getting a good variety of fruits and vegetables.  His favorites are pineapple and strawberries.  His teeth will sometimes give him a hard time with leafy greens. He likes romaine and carrots for his salads.  He has been also trying to find a new salad dressings.  They have also been eating more grilled chicken and different  seasonings. Sid is doing ok on his diet. He says it is about 50/50 on his choices. He is eating more chicken and fish, and adding in a romaine salad for a lot of meals. Sid is doing ok on his diet. He says it is about 50/50 on his choices. He doesn't drink enough water like he should, states he drinks a lot of lemonade.     Expected Outcome Short: Continue to try out new salad dressings.  Long: Continue to focus on healthy eating. Short: Continue to add in more veggies in his diet. Long: Continue to focus on healthy eating. Short: Try to incorporate more water in his diet. Long: Continue to add in healthy choices.        Nutrition Goals Discharge (Final Nutrition Goals Re-Evaluation):  Nutrition Goals Re-Evaluation - 05/28/24 1104       Goals   Nutrition Goal Heart Healthy Diet    Comment Sid is doing ok on his diet. He says it is about 50/50 on his choices. He doesn't drink enough water like he should, states he drinks a lot of lemonade.    Expected Outcome Short: Try to incorporate more water in his diet. Long: Continue to add in healthy choices.          Psychosocial: Target Goals: Acknowledge  presence or absence of significant depression and/or stress, maximize coping skills, provide positive support system. Participant is able to verbalize types and ability to use techniques and skills needed for reducing stress and depression.  Initial Review & Psychosocial Screening:  Initial Psych Review & Screening - 03/11/24 0815       Initial Review   Current issues with Current Sleep Concerns;Current Stress Concerns    Source of Stress Concerns Unable to participate in former interests or hobbies;Unable to perform yard/household activities    Comments not a back sleeper and having a difficult time to sleep, restritctions to activity      Family Dynamics   Good Support System? Yes   wife     Barriers   Psychosocial barriers to participate in program The patient should benefit from  training in stress management and relaxation.;Psychosocial barriers identified (see note)      Screening Interventions   Interventions Encouraged to exercise;To provide support and resources with identified psychosocial needs;Provide feedback about the scores to participant    Expected Outcomes Short Term goal: Utilizing psychosocial counselor, staff and physician to assist with identification of specific Stressors or current issues interfering with healing process. Setting desired goal for each stressor or current issue identified.;Long Term Goal: Stressors or current issues are controlled or eliminated.;Short Term goal: Identification and review with participant of any Quality of Life or Depression concerns found by scoring the questionnaire.;Long Term goal: The participant improves quality of Life and PHQ9 Scores as seen by post scores and/or verbalization of changes          Quality of Life Scores:  Quality of Life - 06/14/24 1533       Quality of Life   Select Quality of Life      Quality of Life Scores   Health/Function Pre 17.2 %    Health/Function Post 20.8 %    Health/Function % Change 20.93 %    Socioeconomic Pre 27.75 %    Socioeconomic Post 26.44 %    Socioeconomic % Change  -4.72 %    Psych/Spiritual Pre 24 %    Psych/Spiritual Post 23.14 %    Psych/Spiritual % Change -3.58 %    Family Pre 24 %    Family Post 30 %    Family % Change 25 %    GLOBAL Pre 21.94 %    GLOBAL Post 23.87 %    GLOBAL % Change 8.8 %         Scores of 19 and below usually indicate a poorer quality of life in these areas.  A difference of  2-3 points is a clinically meaningful difference.  A difference of 2-3 points in the total score of the Quality of Life Index has been associated with significant improvement in overall quality of life, self-image, physical symptoms, and general health in studies assessing change in quality of life.  PHQ-9: Review Flowsheet  More data may exist       06/14/2024 06/10/2024 05/24/2024 04/12/2024 03/16/2024  Depression screen PHQ 2/9  Decreased Interest 0 0 0 1 1  Down, Depressed, Hopeless 1 0 1 1 1   PHQ - 2 Score 1 0 1 2 2   Altered sleeping 1 0 0 0 3  Tired, decreased energy 1 0 1 1 2   Change in appetite 1 0 1 1 2   Feeling bad or failure about yourself  1 0 1 1 1   Trouble concentrating 0 0 0 0 1  Moving slowly or fidgety/restless 1 0  1 0 1  Suicidal thoughts 0 0 0 0 0  PHQ-9 Score 6 0 5 5 12   Difficult doing work/chores Somewhat difficult Not difficult at all Not difficult at all Somewhat difficult Somewhat difficult   Interpretation of Total Score  Total Score Depression Severity:  1-4 = Minimal depression, 5-9 = Mild depression, 10-14 = Moderate depression, 15-19 = Moderately severe depression, 20-27 = Severe depression   Psychosocial Evaluation and Intervention:  Psychosocial Evaluation - 03/11/24 0843       Psychosocial Evaluation & Interventions   Interventions Stress management education;Encouraged to exercise with the program and follow exercise prescription    Comments Sid is coming into cardiac rehab after STEMI and bypass surgery in March.  [place] his heart attack and presented at [place] then transported to [facility] in [place]. He is still having some incisional soreness since surgery, especially on right side.  Since surgery, he has stopped his insulin after having a few episodes of 40s-60s and talking with his doctor.  He is eager to get moving again and wants to build his strength back up.  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  He is also having a hard time sleeping since surgery.  He usually only gets a few hours a night.  He has been sleeping in bed and recliner on his back but is usually a side sleeper.  He really wants to get  back to sleeping.  He has tried some home remedies, but with occassional help and exhaustion to get some rest.  We talked about using a supported side sleeping position to not harm chest as he is transitioning his sleep position to try to get more rest.  He is looking forward to program and learning his new limitiations and how hard to push himself.  His wife has been his support through all this and she plans to attend orientation with him as well.  She is also on him about taking all of his medications and montioring his blood sugars.  He is now using a Dexcom and has found it very helpful in maintain his sugars.  He will only use his insulin on the rare occassions that his sugar is too high.    Expected Outcomes Short: Attend rehab to build up strength and stamina Long: Continue to cope with restrictions until lifted    Continue Psychosocial Services  Follow up required by staff          Psychosocial Re-Evaluation:  Psychosocial Re-Evaluation     Row Name 03/19/24 1210 04/07/24 1124 05/10/24 1122 05/24/24 1125 05/28/24 1102     Psychosocial Re-Evaluation   Current issues with Current Sleep Concerns Current Sleep Concerns;Current Depression Current Sleep Concerns;Current Stress Concerns Current Sleep Concerns;Current Stress Concerns Current Stress Concerns   Comments Sid is off to a good start.  Continues to have issues with sleeping. He has been trying out melatonin.  We talked about some different options to try and sent him some sleep information from [facility] and on sleep hygiene to review. We also discussed trying supported side sleeping. Sid is doing well in rehab.  His still having issues sleeping as he is still on his back. He was encouraged to try to transition to  his side with support.  His lack of sleep and cooler weather have him feeling depressed some days, but he feels like he is managing okay.  He also notes that he is more emotional now but aware of how he is feeling. He did review sleep  stuff and has tried melatonin and found it helps some.  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 He is now two months out and starting to countdown to getting off restrictions.  He has enjoyed coming to classes and getting to meet other people and learn more. Sid is doing well in rehab. His mental status is good depending on the day. Sleep is improving some, he is sleeping on his side some but more on his back. He will be happy when he can sleep on his side all the time. He recently celebrated a birthday and he spent it at [employer]. He has a beach house so he spends a lot of time there to help him unwind and relax. Reviewed PHQ with patient. Score of 5, will re-assess in another month. Was an outlet for patient to talk about any issues, and encouraged to continue engaging in exercise and things that bring him happiness. Sid is sleeping better, he is able to sleep on his side now so he is happy about that. He still has his ups and downs with his mood. He said him and his wife had a long talk the other day about life and things going on, so he has her as an outlet to talk to.   Expected Outcomes Short: Review sleep info Long: Improved sleep quality. Short: Continue to work on sleep Long: Continue to stay positive Short: Continue to work on sleep. Long: Be able to sleep on side more than he is now. -- Short: Continue to attend rehab. Long: Continue to find outlets for his stress.   Interventions Encouraged to attend Cardiac Rehabilitation for the exercise;Stress management education;Relaxation education Encouraged to attend Cardiac Rehabilitation for the exercise;Stress management education;Relaxation education Encouraged to attend Cardiac Rehabilitation for the exercise;Stress management education;Relaxation education Encouraged to attend Cardiac Rehabilitation for the exercise;Stress management education;Relaxation education Encouraged to attend  Cardiac Rehabilitation for the exercise;Stress management education;Relaxation education   Continue Psychosocial Services  Follow up required by staff Follow up required by staff Follow up required by staff Follow up required by staff Follow up required by staff      Psychosocial Discharge (Final Psychosocial Re-Evaluation):  Psychosocial Re-Evaluation - 05/28/24 1102       Psychosocial Re-Evaluation   Current issues with Current Stress Concerns    Comments Sid is sleeping better, he is able to sleep on his side now so he is happy about that. He still has his ups and downs with his mood. He said him and his wife had a long talk the other day about life and things going on, so he has her as an outlet to talk to.    Expected Outcomes Short: Continue to attend rehab. Long: Continue to find outlets for his stress.    Interventions Encouraged to attend Cardiac Rehabilitation for the exercise;Stress management education;Relaxation education    Continue Psychosocial Services  Follow up required by staff          Vocational Rehabilitation: Provide vocational rehab assistance to qualifying candidates.   Vocational Rehab Evaluation & Intervention:  Vocational Rehab - 03/11/24 9188  Initial Vocational Rehab Evaluation & Intervention   Assessment shows need for Vocational Rehabilitation No   retired         Education: Education Goals: Education classes will be provided on a weekly basis, covering required topics. Participant will state understanding/return demonstration of topics presented.  Learning Barriers/Preferences:  Learning Barriers/Preferences - 03/11/24 0810       Learning Barriers/Preferences   Learning Barriers Sight   contacts and reading glasses   Learning Preferences None          Education Topics: Hypertension, Hypertension Reduction -Define heart disease and high blood pressure. Discus how high blood pressure affects the body and ways to reduce high blood  pressure.   Exercise and Your Heart -Discuss why it is important to exercise, the FITT principles of exercise, normal and abnormal responses to exercise, and how to exercise safely.   Angina -Discuss definition of angina, causes of angina, treatment of angina, and how to decrease risk of having angina.   Cardiac Medications -Review what the following cardiac medications are used for, how they affect the body, and side effects that may occur when taking the medications.  Medications include Aspirin, Beta blockers, calcium  channel blockers, ACE Inhibitors, angiotensin receptor blockers, diuretics, digoxin, and antihyperlipidemics. Flowsheet Row CARDIAC REHAB PHASE II EXERCISE from 06/16/2024 in [place] [facility]  Date 04/07/24  Educator jh1  Instruction Review Code 1- Verbalizes Understanding    Congestive Heart Failure -Discuss the definition of CHF, how to live with CHF, the signs and symptoms of CHF, and how keep track of weight and sodium intake.   Heart Disease and Intimacy -Discus the effect sexual activity has on the heart, how changes occur during intimacy as we age, and safety during sexual activity.   Smoking Cessation / COPD -Discuss different methods to quit smoking, the health benefits of quitting smoking, and the definition of COPD.   Nutrition I: Fats -Discuss the types of cholesterol, what cholesterol does to the heart, and how cholesterol levels can be controlled. Flowsheet Row CARDIAC REHAB PHASE II EXERCISE from 06/16/2024 in [place]  Date 03/17/24  Educator jh  Instruction Review Code 1- Verbalizes Understanding    Nutrition II: Labels -Discuss the different components of food labels and how to read food label   Heart Parts/Heart Disease and PAD -Discuss the anatomy of the heart, the pathway of blood circulation through the heart, and these are affected by heart disease.   Stress I: Signs and Symptoms -Discuss the  causes of stress, how stress may lead to anxiety and depression, and ways to limit stress. Flowsheet Row CARDIAC REHAB PHASE II EXERCISE from 06/16/2024 in [place]  Date 03/24/24  Educator HB  Instruction Review Code 1- Verbalizes Understanding    Stress II: Relaxation -Discuss different types of relaxation techniques to limit stress. Flowsheet Row CARDIAC REHAB PHASE II EXERCISE from 06/16/2024 in [place] [facility]  Date 05/26/24  Educator jh  Instruction Review Code 1- Verbalizes Understanding    Warning Signs of Stroke / TIA -Discuss definition of a stroke, what the signs and symptoms are of a stroke, and how to identify when someone is having stroke.   Knowledge Questionnaire Score:  Knowledge Questionnaire Score - 06/14/24 1535       Knowledge Questionnaire Score   Post Score 21/24          Core Components/Risk Factors/Patient Goals at Admission:  Personal Goals and Risk Factors at Admission - 03/16/24 9063  Core Components/Risk Factors/Patient Goals on Admission    Weight Management Yes;Weight Loss;Weight Maintenance    Intervention Weight Management: Develop a combined nutrition and exercise program designed to reach desired caloric intake, while maintaining appropriate intake of nutrient and fiber, sodium and fats, and appropriate energy expenditure required for the weight goal.;Weight Management: Provide education and appropriate resources to help participant work on and attain dietary goals.;Weight Management/Obesity: Establish reasonable short term and long term weight goals.    Admit Weight 185 lb 14.4 oz (84.3 kg)    Goal Weight: Short Term 180 lb (81.6 kg)    Goal Weight: Long Term 180 lb (81.6 kg)    Expected Outcomes Short Term: Continue to assess and modify interventions until short term weight is achieved;Long Term: Adherence to nutrition and physical activity/exercise program aimed toward attainment of established  weight goal;Weight Loss: Understanding of general recommendations for a balanced deficit meal plan, which promotes 1-2 lb weight loss per week and includes a negative energy balance of [phone] kcal/d;Understanding recommendations for meals to include 15-35% energy as protein, 25-35% energy from fat, 35-60% energy from carbohydrates, less than 200mg  of dietary cholesterol, 20-35 gm of total fiber daily;Understanding of distribution of calorie intake throughout the day with the consumption of 4-5 meals/snacks;Weight Maintenance: Understanding of the daily nutrition guidelines, which includes 25-35% calories from fat, 7% or less cal from saturated fats, less than 200mg  cholesterol, less than 1.5gm of sodium, & 5 or more servings of fruits and vegetables daily    Diabetes Yes    Intervention Provide education about signs/symptoms and action to take for hypo/hyperglycemia.;Provide education about proper nutrition, including hydration, and aerobic/resistive exercise prescription along with prescribed medications to achieve blood glucose in normal ranges: Fasting glucose 65-99 mg/dL    Expected Outcomes Short Term: Participant verbalizes understanding of the signs/symptoms and immediate care of hyper/hypoglycemia, proper foot care and importance of medication, aerobic/resistive exercise and nutrition plan for blood glucose control.;Long Term: Attainment of HbA1C < 7%.    Hypertension Yes    Intervention Provide education on lifestyle modifcations including regular physical activity/exercise, weight management, moderate sodium restriction and increased consumption of fresh fruit, vegetables, and low fat dairy, alcohol moderation, and smoking cessation.;Monitor prescription use compliance.    Expected Outcomes Short Term: Continued assessment and intervention until BP is < 140/7mm HG in hypertensive participants. < 130/30mm HG in hypertensive participants with diabetes, heart failure or chronic kidney disease.;Long  Term: Maintenance of blood pressure at goal levels.    Lipids Yes    Intervention Provide education and support for participant on nutrition & aerobic/resistive exercise along with prescribed medications to achieve LDL 70mg , HDL >40mg .    Expected Outcomes Short Term: Participant states understanding of desired cholesterol values and is compliant with medications prescribed. Participant is following exercise prescription and nutrition guidelines.;Long Term: Cholesterol controlled with medications as prescribed, with individualized exercise RX and with personalized nutrition plan. Value goals: LDL < 70mg , HDL > 40 mg.          Core Components/Risk Factors/Patient Goals Review:   Goals and Risk Factor Review     Row Name 04/07/24 1129 05/10/24 1127 05/28/24 1105         Core Components/Risk Factors/Patient Goals Review   Personal Goals Review Weight Management/Obesity;Hypertension;Diabetes;Lipids Hypertension;Diabetes;Lipids Hypertension;Diabetes;Lipids     Review Sid is doing well in rehab.  His weight is holding steady and trends down overall since before his heart event.  His sugars are doing well and he has restarted his  insullin for 8 units at night and it has been steady. He is checking them at home.  His pressures are doing well in the 130/70s and he checks those at home as well.  He is doing well on his medicaitons. Sid is doing well in rehab. His weight is staying good and he takes all his meds as prescribed. His BP is running good about 110/60's and blood sugars are running good to around 100-120's. Sid is doing well in rehab. He has a dexcom that he wears all the time to check his sugars and he says they have been controlled well. He also has a BP machine at home that he will check if he is feeling funny, but otherwise he goes off our readings.     Expected Outcomes Short: Continue to monitor sugars and pressure closely Long: Continue to monitor risk factors Short: Continue to check  blood sugars and pressures at home. Long: Continue to monitor risk factors. Short: Continue to attend rehab. Long: Continue to check blood pressures more at home.        Core Components/Risk Factors/Patient Goals at Discharge (Final Review):   Goals and Risk Factor Review - 05/28/24 1105       Core Components/Risk Factors/Patient Goals Review   Personal Goals Review Hypertension;Diabetes;Lipids    Review Sid is doing well in rehab. He has a dexcom that he wears all the time to check his sugars and he says they have been controlled well. He also has a BP machine at home that he will check if he is feeling funny, but otherwise he goes off our readings.    Expected Outcomes Short: Continue to attend rehab. Long: Continue to check blood pressures more at home.          ITP Comments:  ITP Comments     Row Name 03/11/24 0835 03/16/24 0930 03/17/24 1041 03/31/24 0955 04/28/24 0905   ITP Comments Completed virtual orientation today.  EP evaluation is scheduled for Tuesday 03/18/24 at 830 .  Documentation for diagnosis can be found in CE for [facility] 01/31/24 . Patient attend orientation today.  Patient is attending Cardiac Rehabilitation Program.  Documentation for diagnosis can be found in media and CE for [facility] encounter date 01/31/24.  Reviewed medical chart, RPE/RPD, gym safety, and program guidelines.  Patient was fitted to equipment they will be using during rehab.  Patient is scheduled to start exercise on Wednesday 03/17/24 at 1100.   Initial ITP created and sent for review and signature by Dr. Dorn Ross, Medical Director for Cardiac Rehabilitation Program. First full day of exercise!  Patient was oriented to gym and equipment including functions, settings, policies, and procedures.  Patient's individual exercise prescription and treatment plan were reviewed.  All starting workloads were established based on the results of the 6 minute walk test done at initial orientation visit.   The plan for exercise progression was also introduced and progression will be customized based on patient's performance and goals. 30 day review completed. ITP sent to Dr. Dorn Ross, Medical Director of Cardiac Rehab. Continue with ITP unless changes are made by physician.  Newer to program 30 day review completed. ITP sent to Dr. Dorn Ross, Medical Director of Cardiac Rehab. Continue with ITP unless changes are made by physician.    Row Name 05/26/24 0843           ITP Comments 30 day review completed. ITP sent to Dr. Dorn Ross, Medical Director of Cardiac Rehab.  Continue with ITP unless changes are made by physician.          Comments: Discharge ITP

## 2024-06-17 NOTE — Telephone Encounter (Signed)
 Spoke to pt scheduled him for a nurse visit to connect his Dexcom to our office

## 2024-06-18 ENCOUNTER — Encounter (HOSPITAL_COMMUNITY)

## 2024-06-21 ENCOUNTER — Encounter (HOSPITAL_COMMUNITY)

## 2024-06-23 ENCOUNTER — Encounter (HOSPITAL_COMMUNITY)

## 2024-06-25 ENCOUNTER — Other Ambulatory Visit (INDEPENDENT_AMBULATORY_CARE_PROVIDER_SITE_OTHER)

## 2024-06-25 ENCOUNTER — Ambulatory Visit: Payer: Self-pay | Admitting: Family

## 2024-06-25 ENCOUNTER — Ambulatory Visit

## 2024-06-25 ENCOUNTER — Encounter (HOSPITAL_COMMUNITY)

## 2024-06-25 DIAGNOSIS — D649 Anemia, unspecified: Secondary | ICD-10-CM

## 2024-06-25 DIAGNOSIS — I10 Essential (primary) hypertension: Secondary | ICD-10-CM

## 2024-06-25 DIAGNOSIS — Z125 Encounter for screening for malignant neoplasm of prostate: Secondary | ICD-10-CM

## 2024-06-25 DIAGNOSIS — E119 Type 2 diabetes mellitus without complications: Secondary | ICD-10-CM

## 2024-06-25 DIAGNOSIS — R809 Proteinuria, unspecified: Secondary | ICD-10-CM

## 2024-06-25 LAB — CBC WITH DIFFERENTIAL/PLATELET
Basophils Absolute: 0 K/uL (ref 0.0–0.1)
Basophils Relative: 0.7 % (ref 0.0–3.0)
Eosinophils Absolute: 0.4 K/uL (ref 0.0–0.7)
Eosinophils Relative: 6.6 % — ABNORMAL HIGH (ref 0.0–5.0)
HCT: 33.7 % — ABNORMAL LOW (ref 39.0–52.0)
Hemoglobin: 10.7 g/dL — ABNORMAL LOW (ref 13.0–17.0)
Lymphocytes Relative: 16.4 % (ref 12.0–46.0)
Lymphs Abs: 1 K/uL (ref 0.7–4.0)
MCHC: 31.6 g/dL (ref 30.0–36.0)
MCV: 75.7 fl — ABNORMAL LOW (ref 78.0–100.0)
Monocytes Absolute: 0.8 K/uL (ref 0.1–1.0)
Monocytes Relative: 14 % — ABNORMAL HIGH (ref 3.0–12.0)
Neutro Abs: 3.7 K/uL (ref 1.4–7.7)
Neutrophils Relative %: 62.3 % (ref 43.0–77.0)
Platelets: 217 K/uL (ref 150.0–400.0)
RBC: 4.46 Mil/uL (ref 4.22–5.81)
RDW: 19.7 % — ABNORMAL HIGH (ref 11.5–15.5)
WBC: 5.9 K/uL (ref 4.0–10.5)

## 2024-06-25 LAB — COMPREHENSIVE METABOLIC PANEL WITH GFR
ALT: 7 U/L (ref 0–53)
AST: 11 U/L (ref 0–37)
Albumin: 3.9 g/dL (ref 3.5–5.2)
Alkaline Phosphatase: 47 U/L (ref 39–117)
BUN: 13 mg/dL (ref 6–23)
CO2: 26 meq/L (ref 19–32)
Calcium: 9 mg/dL (ref 8.4–10.5)
Chloride: 104 meq/L (ref 96–112)
Creatinine, Ser: 1.41 mg/dL (ref 0.40–1.50)
GFR: 52.8 mL/min — ABNORMAL LOW (ref >60.00)
Glucose, Bld: 99 mg/dL (ref 70–99)
Potassium: 4.2 meq/L (ref 3.5–5.1)
Sodium: 138 meq/L (ref 135–145)
Total Bilirubin: 0.4 mg/dL (ref 0.2–1.2)
Total Protein: 6.6 g/dL (ref 6.0–8.3)

## 2024-06-25 LAB — LIPID PANEL
Cholesterol: 91 mg/dL (ref 0–200)
HDL: 32.8 mg/dL — ABNORMAL LOW (ref >39.00)
LDL Cholesterol: 28 mg/dL (ref 0–99)
NonHDL: 57.96
Total CHOL/HDL Ratio: 3
Triglycerides: 150 mg/dL — ABNORMAL HIGH (ref 0.0–149.0)
VLDL: 30 mg/dL (ref 0.0–40.0)

## 2024-06-25 LAB — MICROALBUMIN / CREATININE URINE RATIO
Creatinine,U: 125.4 mg/dL
Microalb Creat Ratio: 140.3 mg/g — ABNORMAL HIGH (ref 0.0–30.0)
Microalb, Ur: 17.6 mg/dL — ABNORMAL HIGH (ref 0.0–1.9)

## 2024-06-25 LAB — TSH: TSH: 2.66 u[IU]/mL (ref 0.35–5.50)

## 2024-06-25 LAB — PSA: PSA: 0.97 ng/mL (ref 0.10–4.00)

## 2024-06-25 NOTE — Progress Notes (Signed)
 Pt came into office to have us  connect him to Dexcom. Pt was connected and his data was downloaded and printed off for review

## 2024-06-28 ENCOUNTER — Encounter (HOSPITAL_COMMUNITY)

## 2024-06-30 ENCOUNTER — Encounter (HOSPITAL_COMMUNITY)

## 2024-07-02 ENCOUNTER — Encounter (HOSPITAL_COMMUNITY)

## 2024-07-05 ENCOUNTER — Encounter (HOSPITAL_COMMUNITY)

## 2024-07-06 ENCOUNTER — Telehealth: Payer: Self-pay | Admitting: Family

## 2024-07-06 NOTE — Telephone Encounter (Signed)
 Spoke to pt discuss results in detail pt verbalized understanding, will continue to take medication as instructed. Will schedule 3 mnth f/up  from last date of A1C when he checks schedule

## 2024-07-06 NOTE — Telephone Encounter (Signed)
 LVM to call back to office to discuss message below per Rollene

## 2024-07-06 NOTE — Telephone Encounter (Signed)
 Call pt  I reviewed glucose data 7 of 14 days Active 70% Average glucose 155  No hypoglycemic episodes 5 % very high  22% very high  73% in range  Advise to continue :metformin  1000mg  bid, lantus 8 units daily   Sch fu 3 mos after last A1c Advise pt to call with blood sugar < 70 or persistently > 250

## 2024-07-07 ENCOUNTER — Encounter (HOSPITAL_COMMUNITY)

## 2024-07-08 ENCOUNTER — Other Ambulatory Visit: Payer: Self-pay | Admitting: Family

## 2024-07-08 DIAGNOSIS — D649 Anemia, unspecified: Secondary | ICD-10-CM

## 2024-07-08 MED ORDER — EMPAGLIFLOZIN 10 MG PO TABS: 10.0000 mg | ORAL_TABLET | Freq: Every day | ORAL | 2 refills | Status: DC

## 2024-07-08 NOTE — Addendum Note (Signed)
 Addended by: Adrien Dietzman on: 07/08/2024 02:53 PM   Modules accepted: Orders

## 2024-07-08 NOTE — Telephone Encounter (Signed)
 Spoke to pt spouse informed her that Jardiance  10 mg was sent in to Pharmacy

## 2024-07-08 NOTE — Telephone Encounter (Unsigned)
 Copied from CRM [id number]. Topic: Clinical - Medication Question >> Jul 08, 2024  2:11 PM Franky GRADE wrote: Reason for CRM: Patient's spouse is calling to follow up with Jenate regarding the Jardiance  prescription that was suppose to be sent to the pharmacy per their previous conversation. They are leaving out of town tomorrow morning at 6:30 am and would like to pick up the medication today if possible.

## 2024-07-09 ENCOUNTER — Encounter (HOSPITAL_COMMUNITY)

## 2024-07-12 ENCOUNTER — Encounter (HOSPITAL_COMMUNITY)

## 2024-07-13 ENCOUNTER — Encounter: Payer: Self-pay | Admitting: Family

## 2024-07-14 ENCOUNTER — Encounter (HOSPITAL_COMMUNITY)

## 2024-07-16 ENCOUNTER — Encounter (HOSPITAL_COMMUNITY)

## 2024-09-13 ENCOUNTER — Ambulatory Visit: Admitting: Family

## 2024-09-21 ENCOUNTER — Other Ambulatory Visit: Payer: Self-pay | Admitting: Physician Assistant

## 2024-09-21 DIAGNOSIS — E7849 Other hyperlipidemia: Secondary | ICD-10-CM

## 2024-10-19 ENCOUNTER — Other Ambulatory Visit: Payer: Self-pay | Admitting: Family

## 2024-10-22 DIAGNOSIS — I25118 Atherosclerotic heart disease of native coronary artery with other forms of angina pectoris: Secondary | ICD-10-CM | POA: Diagnosis not present
